# Patient Record
Sex: Female | Born: 1950 | Race: Black or African American | Hispanic: No | Smoking: Never smoker
Health system: Southern US, Community
[De-identification: ages and names within clinical notes are randomized; demographics above are authoritative.]

## PROBLEM LIST (undated history)

## (undated) DIAGNOSIS — T7840XA Allergy, unspecified, initial encounter: Secondary | ICD-10-CM

## (undated) DIAGNOSIS — B019 Varicella without complication: Secondary | ICD-10-CM

## (undated) DIAGNOSIS — R7303 Prediabetes: Secondary | ICD-10-CM

## (undated) DIAGNOSIS — M1990 Unspecified osteoarthritis, unspecified site: Secondary | ICD-10-CM

## (undated) DIAGNOSIS — I10 Essential (primary) hypertension: Secondary | ICD-10-CM

## (undated) DIAGNOSIS — R55 Syncope and collapse: Secondary | ICD-10-CM

## (undated) DIAGNOSIS — E785 Hyperlipidemia, unspecified: Secondary | ICD-10-CM

## (undated) HISTORY — PX: TUBAL LIGATION: SHX77

## (undated) HISTORY — DX: Prediabetes: R73.03

## (undated) HISTORY — DX: Syncope and collapse: R55

## (undated) HISTORY — DX: Varicella without complication: B01.9

## (undated) HISTORY — DX: Hyperlipidemia, unspecified: E78.5

## (undated) HISTORY — DX: Allergy, unspecified, initial encounter: T78.40XA

## (undated) HISTORY — PX: COLONOSCOPY: SHX174

## (undated) HISTORY — DX: Essential (primary) hypertension: I10

## (undated) HISTORY — DX: Unspecified osteoarthritis, unspecified site: M19.90

---

## 2003-04-05 ENCOUNTER — Other Ambulatory Visit: Admission: RE | Admit: 2003-04-05 | Discharge: 2003-04-05 | Payer: Self-pay | Admitting: Obstetrics and Gynecology

## 2016-09-07 LAB — HEPATIC FUNCTION PANEL
ALK PHOS: 79 (ref 25–125)
ALT: 37 — AB (ref 7–35)
AST: 22 (ref 13–35)
Bilirubin, Total: 0.3

## 2016-09-07 LAB — BASIC METABOLIC PANEL
BUN: 19 (ref 4–21)
Creatinine: 1 (ref 0.5–1.1)
GLUCOSE: 119
POTASSIUM: 3.6 (ref 3.4–5.3)
SODIUM: 139 (ref 137–147)

## 2016-09-07 LAB — HEMOGLOBIN A1C: Hemoglobin A1C: 6.3

## 2016-09-07 LAB — CBC AND DIFFERENTIAL
HEMATOCRIT: 40 (ref 36–46)
HEMOGLOBIN: 13.1 (ref 12.0–16.0)
Platelets: 359 (ref 150–399)
WBC: 4.6

## 2017-03-22 ENCOUNTER — Ambulatory Visit (INDEPENDENT_AMBULATORY_CARE_PROVIDER_SITE_OTHER): Payer: 59 | Admitting: Family Medicine

## 2017-03-22 ENCOUNTER — Encounter: Payer: Self-pay | Admitting: Family Medicine

## 2017-03-22 ENCOUNTER — Telehealth: Payer: Self-pay | Admitting: Family Medicine

## 2017-03-22 VITALS — BP 128/80 | Temp 97.6°F | Ht 66.0 in | Wt 212.4 lb

## 2017-03-22 DIAGNOSIS — Z13820 Encounter for screening for osteoporosis: Secondary | ICD-10-CM | POA: Insufficient documentation

## 2017-03-22 DIAGNOSIS — I10 Essential (primary) hypertension: Secondary | ICD-10-CM | POA: Diagnosis not present

## 2017-03-22 DIAGNOSIS — Z1211 Encounter for screening for malignant neoplasm of colon: Secondary | ICD-10-CM | POA: Diagnosis not present

## 2017-03-22 DIAGNOSIS — E559 Vitamin D deficiency, unspecified: Secondary | ICD-10-CM | POA: Diagnosis not present

## 2017-03-22 DIAGNOSIS — E119 Type 2 diabetes mellitus without complications: Secondary | ICD-10-CM | POA: Diagnosis not present

## 2017-03-22 LAB — LIPID PANEL
CHOL/HDL RATIO: 2
Cholesterol: 147 mg/dL (ref 0–200)
HDL: 60.2 mg/dL (ref >39.00)
LDL Cholesterol: 79 mg/dL (ref 0–99)
NONHDL: 87.11
Triglycerides: 39 mg/dL (ref 0.0–149.0)
VLDL: 7.8 mg/dL (ref 0.0–40.0)

## 2017-03-22 LAB — TSH: TSH: 2.4 u[IU]/mL (ref 0.35–4.50)

## 2017-03-22 LAB — COMPREHENSIVE METABOLIC PANEL
ALK PHOS: 60 U/L (ref 39–117)
ALT: 19 U/L (ref 0–35)
AST: 18 U/L (ref 0–37)
Albumin: 3.9 g/dL (ref 3.5–5.2)
BILIRUBIN TOTAL: 0.6 mg/dL (ref 0.2–1.2)
BUN: 22 mg/dL (ref 6–23)
CO2: 31 mEq/L (ref 19–32)
Calcium: 9.3 mg/dL (ref 8.4–10.5)
Chloride: 108 mEq/L (ref 96–112)
Creatinine, Ser: 1.09 mg/dL (ref 0.40–1.20)
GFR: 64.42 mL/min (ref >60.00)
Glucose, Bld: 96 mg/dL (ref 70–99)
Potassium: 4.1 mEq/L (ref 3.5–5.1)
Sodium: 144 mEq/L (ref 135–145)
TOTAL PROTEIN: 6.8 g/dL (ref 6.0–8.3)

## 2017-03-22 LAB — URINALYSIS, ROUTINE W REFLEX MICROSCOPIC
Bilirubin Urine: NEGATIVE
Ketones, ur: NEGATIVE
Nitrite: NEGATIVE
SPECIFIC GRAVITY, URINE: 1.025 (ref 1.000–1.030)
TOTAL PROTEIN, URINE-UPE24: NEGATIVE
URINE GLUCOSE: NEGATIVE
UROBILINOGEN UA: 0.2 (ref 0.0–1.0)
pH: 5.5 (ref 5.0–8.0)

## 2017-03-22 LAB — CBC
HCT: 40.2 % (ref 36.0–46.0)
HEMOGLOBIN: 13.3 g/dL (ref 12.0–15.0)
MCHC: 33.2 g/dL (ref 30.0–36.0)
MCV: 87.3 fl (ref 78.0–100.0)
Platelets: 319 10*3/uL (ref 150.0–400.0)
RBC: 4.6 Mil/uL (ref 3.87–5.11)
RDW: 14.8 % (ref 11.5–15.5)
WBC: 3.1 10*3/uL — AB (ref 4.0–10.5)

## 2017-03-22 LAB — MICROALBUMIN / CREATININE URINE RATIO
Creatinine,U: 144.5 mg/dL
Microalb Creat Ratio: 0.5 mg/g (ref 0.0–30.0)
Microalb, Ur: <0.7 mg/dL (ref 0.0–1.9)

## 2017-03-22 LAB — VITAMIN D 25 HYDROXY (VIT D DEFICIENCY, FRACTURES): VITD: 27 ng/mL — AB (ref 30.00–100.00)

## 2017-03-22 LAB — HEMOGLOBIN A1C: Hgb A1c MFr Bld: 6.7 % — ABNORMAL HIGH (ref 4.6–6.5)

## 2017-03-22 MED ORDER — METFORMIN HCL ER 500 MG PO TB24: 500.0000 mg | ORAL_TABLET | Freq: Every day | ORAL | 1 refills | Status: DC

## 2017-03-22 MED ORDER — VITAMIN D (ERGOCALCIFEROL) 1.25 MG (50000 UNIT) PO CAPS: 50000.0000 [IU] | ORAL_CAPSULE | ORAL | 0 refills | Status: DC

## 2017-03-22 MED ORDER — LISINOPRIL-HYDROCHLOROTHIAZIDE 10-12.5 MG PO TABS: 1.0000 | ORAL_TABLET | Freq: Every day | ORAL | 1 refills | Status: DC

## 2017-03-22 MED ORDER — METFORMIN HCL ER 500 MG PO TB24: ORAL_TABLET | ORAL | 1 refills | Status: DC

## 2017-03-22 NOTE — Addendum Note (Signed)
Addended by: Kateri Mc E on: 03/22/2017 01:11 PM   Modules accepted: Orders

## 2017-03-22 NOTE — Progress Notes (Addendum)
Subjective:  Patient ID: Ann Mcguire, female    DOB: 31-Aug-1950  Age: 66 y.o. MRN: [id number]  CC: Annual Exam   HPI Ann Mcguire presents for establishment of care and follow-up of her blood pressure and diabetes.  These have both been well controlled.  She has been out of her blood pressure medicine for 2 days.  She continues to work for regular.  She is doing well.  She is up-to-date on her mammogram and Pap smears.  She will need a colonoscopy.  Recent eye exam was normal.  She continues to refuse flu vaccines and pneumonia vaccines.  Her daughter is a Marine scientist who works in the [employer].  History Ann Mcguire has a past medical history of Chicken pox, Fainting spell, and Hypertension.   She has no past surgical history on file.   Her family history includes Hypertension in her sister.She reports that  has never smoked. she has never used smokeless tobacco. She reports that she does not drink alcohol or use drugs.  Outpatient Medications Prior to Visit  Medication Sig Dispense Refill  . lisinopril-hydrochlorothiazide (PRINZIDE,ZESTORETIC) 20-12.5 MG tablet Take 1 tablet by mouth daily.    . metFORMIN (GLUCOPHAGE-XR) 500 MG 24 hr tablet Take 500 mg by mouth at bedtime.     No facility-administered medications prior to visit.     ROS Review of Systems  Constitutional: Negative.   HENT: Negative.   Eyes: Negative for photophobia and visual disturbance.  Respiratory: Negative.   Cardiovascular: Negative.   Gastrointestinal: Negative.   Endocrine: Negative for polyphagia and polyuria.  Genitourinary: Negative for hematuria.  Musculoskeletal: Negative for arthralgias and gait problem.  Allergic/Immunologic: Negative for immunocompromised state.  Neurological: Negative for weakness, numbness and headaches.  Hematological: Does not bruise/bleed easily.  Psychiatric/Behavioral: Negative for dysphoric mood.    Objective:  BP 128/80 (BP Location: Left Arm,  Patient Position: Sitting, Cuff Size: Normal)   Temp 97.6 F (36.4 C) (Oral)   Ht 5\' 6"  (1.676 m)   Wt 212 lb 6 oz (96.3 kg)   BMI 34.28 kg/m   Physical Exam  Constitutional: She is oriented to person, place, and time. She appears well-developed and well-nourished. No distress.  HENT:  Head: Normocephalic and atraumatic.  Right Ear: External ear normal.  Left Ear: External ear normal.  Mouth/Throat: Oropharynx is clear and moist. No oropharyngeal exudate.  Eyes: Conjunctivae are normal. Pupils are equal, round, and reactive to light. Right eye exhibits no discharge. Left eye exhibits no discharge. No scleral icterus.  Neck: Neck supple. No JVD present. No tracheal deviation present. No thyromegaly present.  Cardiovascular: Normal rate, regular rhythm and normal heart sounds.  Pulmonary/Chest: Effort normal and breath sounds normal. No stridor.  Abdominal: Soft. Bowel sounds are normal.  Musculoskeletal: She exhibits no edema or tenderness.  Lymphadenopathy:    She has no cervical adenopathy.  Neurological: She is alert and oriented to person, place, and time.  Skin: Skin is warm and dry. No rash noted. She is not diaphoretic.  Psychiatric: She has a normal mood and affect. Her behavior is normal.      Assessment & Plan:   Ann Mcguire was seen today for annual exam.  Diagnoses and all orders for this visit:  Essential hypertension -     CBC -     Comprehensive metabolic panel -     Lipid panel -     TSH -     Urinalysis, Routine w reflex microscopic -  lisinopril-hydrochlorothiazide (PRINZIDE,ZESTORETIC) 10-12.5 MG tablet; Take 1 tablet by mouth daily.  Type 2 diabetes mellitus without complication, without long-term current use of insulin (HCC) -     CBC -     Comprehensive metabolic panel -     Hemoglobin A1c -     Lipid panel -     TSH -     Urinalysis, Routine w reflex microscopic -     Microalbumin / creatinine urine ratio -     lisinopril-hydrochlorothiazide  (PRINZIDE,ZESTORETIC) 10-12.5 MG tablet; Take 1 tablet by mouth daily. -     Discontinue: metFORMIN (GLUCOPHAGE-XR) 500 MG 24 hr tablet; Nightly before bed -     metFORMIN (GLUCOPHAGE-XR) 500 MG 24 hr tablet; Take 1 tablet (500 mg total) by mouth at bedtime.  Vitamin D deficiency -     VITAMIN D 25 Hydroxy (Vit-D Deficiency, Fractures) -     Vitamin D, Ergocalciferol, (DRISDOL) 50000 units CAPS capsule; Take 1 capsule (50,000 Units total) by mouth every 7 (seven) days.  Screen for colon cancer -     Ambulatory referral to Gastroenterology   I have discontinued [name]. Carbin's lisinopril-hydrochlorothiazide and metFORMIN. I have also changed her metFORMIN. Additionally, I am having her start on lisinopril-hydrochlorothiazide and Vitamin D (Ergocalciferol).   Her blood pressure today looks good considering she has not had any medicine in 3 days.  I decreased her lisinopril dose from 20 mg to 10 mg.  She will follow-up in 3 months.  Meds ordered this encounter  Medications  . lisinopril-hydrochlorothiazide (PRINZIDE,ZESTORETIC) 10-12.5 MG tablet    Sig: Take 1 tablet by mouth daily.    Dispense:  90 tablet    Refill:  1  . DISCONTD: metFORMIN (GLUCOPHAGE-XR) 500 MG 24 hr tablet    Sig: Nightly before bed    Dispense:  90 tablet    Refill:  1  . metFORMIN (GLUCOPHAGE-XR) 500 MG 24 hr tablet    Sig: Take 1 tablet (500 mg total) by mouth at bedtime.    Dispense:  90 tablet    Refill:  1  . Vitamin D, Ergocalciferol, (DRISDOL) 50000 units CAPS capsule    Sig: Take 1 capsule (50,000 Units total) by mouth every 7 (seven) days.    Dispense:  30 capsule    Refill:  0     Follow-up: Return in about 3 months (around 06/20/2017).  Libby Maw, MD

## 2017-03-22 NOTE — Telephone Encounter (Signed)
Returning  Call  And  [place]  Results given   Informed  Of  Vit  D  rx   And  Pt   Denies  Any  Urinary  Symptoms    Verbalizes  Knowledge  Of  Plan of  Care

## 2017-03-22 NOTE — Addendum Note (Signed)
Addended by: Abelino Derrick A on: 03/22/2017 02:05 PM   Modules accepted: Orders

## 2017-03-25 NOTE — Telephone Encounter (Signed)
Noted! Thank you

## 2017-04-25 ENCOUNTER — Encounter: Payer: Self-pay | Admitting: Family Medicine

## 2017-05-23 ENCOUNTER — Encounter: Payer: Self-pay | Admitting: Family Medicine

## 2017-06-21 ENCOUNTER — Ambulatory Visit: Payer: 59 | Admitting: Family Medicine

## 2017-10-22 LAB — HM MAMMOGRAPHY

## 2017-10-24 ENCOUNTER — Encounter: Payer: Self-pay | Admitting: Family Medicine

## 2017-11-01 ENCOUNTER — Other Ambulatory Visit (HOSPITAL_COMMUNITY)
Admission: RE | Admit: 2017-11-01 | Discharge: 2017-11-01 | Disposition: A | Discharge status: Home / Self Care | Payer: 59 | Source: Ambulatory Visit | Attending: Family Medicine | Admitting: Family Medicine

## 2017-11-01 ENCOUNTER — Ambulatory Visit (INDEPENDENT_AMBULATORY_CARE_PROVIDER_SITE_OTHER): Payer: 59 | Admitting: Family Medicine

## 2017-11-01 ENCOUNTER — Other Ambulatory Visit: Payer: 59

## 2017-11-01 ENCOUNTER — Encounter: Payer: Self-pay | Admitting: Family Medicine

## 2017-11-01 VITALS — BP 128/80 | HR 77 | Ht 66.0 in | Wt 219.2 lb

## 2017-11-01 DIAGNOSIS — Z91199 Patient's noncompliance with other medical treatment and regimen due to unspecified reason: Secondary | ICD-10-CM

## 2017-11-01 DIAGNOSIS — Z13820 Encounter for screening for osteoporosis: Secondary | ICD-10-CM

## 2017-11-01 DIAGNOSIS — I10 Essential (primary) hypertension: Secondary | ICD-10-CM

## 2017-11-01 DIAGNOSIS — Z9119 Patient's noncompliance with other medical treatment and regimen: Secondary | ICD-10-CM | POA: Insufficient documentation

## 2017-11-01 DIAGNOSIS — E119 Type 2 diabetes mellitus without complications: Secondary | ICD-10-CM | POA: Diagnosis not present

## 2017-11-01 DIAGNOSIS — M7062 Trochanteric bursitis, left hip: Secondary | ICD-10-CM

## 2017-11-01 DIAGNOSIS — E559 Vitamin D deficiency, unspecified: Secondary | ICD-10-CM | POA: Insufficient documentation

## 2017-11-01 DIAGNOSIS — Z7984 Long term (current) use of oral hypoglycemic drugs: Secondary | ICD-10-CM | POA: Diagnosis not present

## 2017-11-01 DIAGNOSIS — R8281 Pyuria: Secondary | ICD-10-CM

## 2017-11-01 DIAGNOSIS — Z79899 Other long term (current) drug therapy: Secondary | ICD-10-CM | POA: Diagnosis not present

## 2017-11-01 DIAGNOSIS — Z1211 Encounter for screening for malignant neoplasm of colon: Secondary | ICD-10-CM | POA: Diagnosis not present

## 2017-11-01 DIAGNOSIS — N390 Urinary tract infection, site not specified: Secondary | ICD-10-CM | POA: Insufficient documentation

## 2017-11-01 LAB — CBC
HEMATOCRIT: 39.8 % (ref 36.0–46.0)
Hemoglobin: 13.2 g/dL (ref 12.0–15.0)
MCHC: 33.1 g/dL (ref 30.0–36.0)
MCV: 86 fl (ref 78.0–100.0)
Platelets: 340 10*3/uL (ref 150.0–400.0)
RBC: 4.62 Mil/uL (ref 3.87–5.11)
RDW: 15.1 % (ref 11.5–15.5)
WBC: 3.4 10*3/uL — ABNORMAL LOW (ref 4.0–10.5)

## 2017-11-01 LAB — COMPREHENSIVE METABOLIC PANEL
ALBUMIN: 3.8 g/dL (ref 3.5–5.2)
ALT: 13 U/L (ref 0–35)
AST: 16 U/L (ref 0–37)
Alkaline Phosphatase: 54 U/L (ref 39–117)
BUN: 14 mg/dL (ref 6–23)
CHLORIDE: 106 meq/L (ref 96–112)
CO2: 29 meq/L (ref 19–32)
CREATININE: 1.12 mg/dL (ref 0.40–1.20)
Calcium: 9.3 mg/dL (ref 8.4–10.5)
GFR: 62.31 mL/min (ref >60.00)
Glucose, Bld: 108 mg/dL — ABNORMAL HIGH (ref 70–99)
POTASSIUM: 4 meq/L (ref 3.5–5.1)
SODIUM: 141 meq/L (ref 135–145)
Total Bilirubin: 0.5 mg/dL (ref 0.2–1.2)
Total Protein: 7 g/dL (ref 6.0–8.3)

## 2017-11-01 LAB — URINALYSIS, ROUTINE W REFLEX MICROSCOPIC
Bilirubin Urine: NEGATIVE
KETONES UR: NEGATIVE
NITRITE: NEGATIVE
PH: 7 (ref 5.0–8.0)
RBC / HPF: NONE SEEN (ref 0–?)
SPECIFIC GRAVITY, URINE: 1.01 (ref 1.000–1.030)
TOTAL PROTEIN, URINE-UPE24: NEGATIVE
URINE GLUCOSE: NEGATIVE
UROBILINOGEN UA: 0.2 (ref 0.0–1.0)

## 2017-11-01 LAB — MICROALBUMIN / CREATININE URINE RATIO
CREATININE, U: 189.7 mg/dL
Microalb Creat Ratio: 0.4 mg/g (ref 0.0–30.0)
Microalb, Ur: <0.7 mg/dL (ref 0.0–1.9)

## 2017-11-01 LAB — HEMOGLOBIN A1C: Hgb A1c MFr Bld: 6.8 % — ABNORMAL HIGH (ref 4.6–6.5)

## 2017-11-01 LAB — VITAMIN D 25 HYDROXY (VIT D DEFICIENCY, FRACTURES): VITD: 20.73 ng/mL — ABNORMAL LOW (ref 30.00–100.00)

## 2017-11-01 MED ORDER — METFORMIN HCL ER 500 MG PO TB24: 500.0000 mg | ORAL_TABLET | Freq: Every day | ORAL | 1 refills | Status: DC

## 2017-11-01 MED ORDER — LISINOPRIL-HYDROCHLOROTHIAZIDE 10-12.5 MG PO TABS: 1.0000 | ORAL_TABLET | Freq: Every day | ORAL | 1 refills | Status: DC

## 2017-11-01 MED ORDER — AZITHROMYCIN 250 MG PO TABS: ORAL_TABLET | ORAL | 0 refills | Status: DC

## 2017-11-01 NOTE — Patient Instructions (Addendum)
Colorectal Cancer Screening Colorectal cancer screening is a group of tests used to check for colorectal cancer. Colorectal refers to your colon and rectum. Your colon and rectum are located at the end of your large intestine and carry your bowel movements out of your body. Why is colorectal cancer screening done? It is common for abnormal growths (polyps) to form in the lining of your colon, especially as you get older. These polyps can be cancerous or become cancerous. If colorectal cancer is found at an early stage, it is treatable. Who should be screened for colorectal cancer? Screening is recommended for all adults at average risk starting at age 47. Tests may be recommended every 1 to 10 years. Your health care provider may recommend earlier or more frequent screening if you have:  A history of colorectal cancer or polyps.  A family member with a history of colorectal cancer or polyps.  Inflammatory bowel disease, such as ulcerative colitis or Crohn disease.  A type of hereditary colon cancer syndrome.  Colorectal cancer symptoms.  Types of screening tests There are several types of colorectal screening tests. They include:  Guaiac-based fecal occult blood testing.  Fecal immunochemical test (FIT).  Stool DNA test.  Barium enema.  Virtual colonoscopy.  Sigmoidoscopy. During this test, a sigmoidoscope is used to examine your rectum and lower colon. A sigmoidoscope is a flexible tube with a camera that is inserted through your anus into your rectum and lower colon.  Colonoscopy. During this test, a colonoscope is used to examine your entire colon. A colonoscope is a long, thin, flexible tube with a camera. This test examines your entire colon and rectum.  This information is not intended to replace advice given to you by your health care provider. Make sure you discuss any questions you have with your health care provider. Document Released: 09/13/2009 Document Revised:  11/03/2015 Document Reviewed: 07/02/2013 Elsevier Interactive Patient Education  2018 [place] protect organs, store calcium, and anchor muscles. Good health habits, such as eating nutritious foods and exercising regularly, are important for maintaining healthy bones. They can also help to prevent a condition that causes bones to lose density and become weak and brittle (osteoporosis). Why is bone mass important? Bone mass refers to the amount of bone tissue that you have. The higher your bone mass, the stronger your bones. An important step toward having healthy bones throughout life is to have strong and dense bones during childhood. A young adult who has a high bone mass is more likely to have a high bone mass later in life. Bone mass at its greatest it is called peak bone mass. A large decline in bone mass occurs in older adults. In women, it occurs about the time of menopause. During this time, it is important to practice good health habits, because if more bone is lost than what is replaced, the bones will become less healthy and more likely to break (fracture). If you find that you have a low bone mass, you may be able to prevent osteoporosis or further bone loss by changing your diet and lifestyle. How can I find out if my bone mass is low? Bone mass can be measured with an X-ray test that is called a bone mineral density (BMD) test. This test is recommended for all women who are age 47 or older. It may also be recommended for men who are age 71 or older, or for people who are more likely to develop osteoporosis  due to:  Having bones that break easily.  Having a long-term disease that weakens bones, such as kidney disease or rheumatoid arthritis.  Having menopause earlier than normal.  Taking medicine that weakens bones, such as steroids, thyroid hormones, or hormone treatment for breast cancer or prostate cancer.  Smoking.  Drinking three or more alcoholic  drinks each day.  What are the nutritional recommendations for healthy bones? To have healthy bones, you need to get enough of the right minerals and vitamins. Most nutrition experts recommend getting these nutrients from the foods that you eat. Nutritional recommendations vary from person to person. Ask your health care provider what is healthy for you. Here are some general guidelines. Calcium Recommendations Calcium is the most important (essential) mineral for bone health. Most people can get enough calcium from their diet, but supplements may be recommended for people who are at risk for osteoporosis. Good sources of calcium include:  Dairy products, such as low-fat or nonfat milk, cheese, and yogurt.  Dark green leafy vegetables, such as bok choy and broccoli.  Calcium-fortified foods, such as orange juice, cereal, bread, soy beverages, and tofu products.  Nuts, such as almonds.  Follow these recommended amounts for daily calcium intake:  Children, age 11?3: 700 mg.  Children, age 48?8: 1,000 mg.  Children, age 24?13: 1,300 mg.  Teens, age 74?18: 1,300 mg.  Adults, age 3?50: 1,000 mg.  Adults, age 117?70: ? Men: 1,000 mg. ? Women: 1,200 mg.  Adults, age 61 or older: 1,200 mg.  Pregnant and breastfeeding females: ? Teens: 1,300 mg. ? Adults: 1,000 mg.  Vitamin D Recommendations Vitamin D is the most essential vitamin for bone health. It helps the body to absorb calcium. Sunlight stimulates the skin to make vitamin D, so be sure to get enough sunlight. If you live in a cold climate or you do not get outside often, your health care provider may recommend that you take vitamin D supplements. Good sources of vitamin D in your diet include:  Egg yolks.  Saltwater fish.  Milk and cereal fortified with vitamin D.  Follow these recommended amounts for daily vitamin D intake:  Children and teens, age 23?18: 45 international units.  Adults, age 485 or younger: 400-800  international units.  Adults, age 487 or older: 800-1,000 international units.  Other Nutrients Other nutrients for bone health include:  Phosphorus. This mineral is found in meat, poultry, dairy foods, nuts, and legumes. The recommended daily intake for adult men and adult women is 700 mg.  Magnesium. This mineral is found in seeds, nuts, dark green vegetables, and legumes. The recommended daily intake for adult men is 400?420 mg. For adult women, it is 310?320 mg.  Vitamin K. This vitamin is found in green leafy vegetables. The recommended daily intake is 120 mg for adult men and 90 mg for adult women.  What type of physical activity is best for building and maintaining healthy bones? Weight-bearing and strength-building activities are important for building and maintaining peak bone mass. Weight-bearing activities cause muscles and bones to work against gravity. Strength-building activities increases muscle strength that supports bones. Weight-bearing and muscle-building activities include:  Walking and hiking.  Jogging and running.  Dancing.  Gym exercises.  Lifting weights.  Tennis and racquetball.  Climbing stairs.  Aerobics.  Adults should get at least 30 minutes of moderate physical activity on most days. Children should get at least 60 minutes of moderate physical activity on most days. Ask your health care provide what  type of exercise is best for you. Where can I find more information? For more information, check out the following websites:  [place]: [employer]se  [employer] of Health: http://www.niams.AnonymousEar.fr.asp  This information is not intended to replace advice given to you by your health care provider. Make sure you discuss any questions you have with your health care provider. Document Released: 06/16/2003 Document Revised: 10/14/2015 Document Reviewed:  03/31/2014 Elsevier Interactive Patient Education  2018 [employer].  Diabetes Mellitus and Exercise Exercising regularly is important for your overall health, especially when you have diabetes (diabetes mellitus). Exercising is not only about losing weight. It has many health benefits, such as increasing muscle strength and bone density and reducing body fat and stress. This leads to improved fitness, flexibility, and endurance, all of which result in better overall health. Exercise has additional benefits for people with diabetes, including:  Reducing appetite.  Helping to lower and control blood glucose.  Lowering blood pressure.  Helping to control amounts of fatty substances (lipids) in the blood, such as cholesterol and triglycerides.  Helping the body to respond better to insulin (improving insulin sensitivity).  Reducing how much insulin the body needs.  Decreasing the risk for heart disease by: ? Lowering cholesterol and triglyceride levels. ? Increasing the levels of good cholesterol. ? Lowering blood glucose levels.  What is my activity plan? Your health care provider or certified diabetes educator can help you make a plan for the type and frequency of exercise (activity plan) that works for you. Make sure that you:  Do at least 150 minutes of moderate-intensity or vigorous-intensity exercise each week. This could be brisk walking, biking, or water aerobics. ? Do stretching and strength exercises, such as yoga or weightlifting, at least 2 times a week. ? Spread out your activity over at least 3 days of the week.  Get some form of physical activity every day. ? Do not go more than 2 days in a row without some kind of physical activity. ? Avoid being inactive for more than 90 minutes at a time. Take frequent breaks to walk or stretch.  Choose a type of exercise or activity that you enjoy, and set realistic goals.  Start slowly, and gradually increase the intensity of  your exercise over time.  What do I need to know about managing my diabetes?  Check your blood glucose before and after exercising. ? If your blood glucose is higher than 240 mg/dL (13.3 mmol/L) before you exercise, check your urine for ketones. If you have ketones in your urine, do not exercise until your blood glucose returns to normal.  Know the symptoms of low blood glucose (hypoglycemia) and how to treat it. Your risk for hypoglycemia increases during and after exercise. Common symptoms of hypoglycemia can include: ? Hunger. ? Anxiety. ? Sweating and feeling clammy. ? Confusion. ? Dizziness or feeling light-headed. ? Increased heart rate or palpitations. ? Blurry vision. ? Tingling or numbness around the mouth, lips, or tongue. ? Tremors or shakes. ? Irritability.  Keep a rapid-acting carbohydrate snack available before, during, and after exercise to help prevent or treat hypoglycemia.  Avoid injecting insulin into areas of the body that are going to be exercised. For example, avoid injecting insulin into: ? The arms, when playing tennis. ? The legs, when jogging.  Keep records of your exercise habits. Doing this can help you and your health care provider adjust your diabetes management plan as needed. Write down: ? Food that you eat  before and after you exercise. ? Blood glucose levels before and after you exercise. ? The type and amount of exercise you have done. ? When your insulin is expected to peak, if you use insulin. Avoid exercising at times when your insulin is peaking.  When you start a new exercise or activity, work with your health care provider to make sure the activity is safe for you, and to adjust your insulin, medicines, or food intake as needed.  Drink plenty of water while you exercise to prevent dehydration or heat stroke. Drink enough fluid to keep your urine clear or pale yellow. This information is not intended to replace advice given to you by your  health care provider. Make sure you discuss any questions you have with your health care provider. Document Released: 06/16/2003 Document Revised: 10/14/2015 Document Reviewed: 09/05/2015 Elsevier Interactive Patient Education  2018 [place].  Hip Bursitis Hip bursitis is inflammation of a fluid-filled sac (bursa) in the hip joint. The bursa protects the bones in the hip joint from rubbing against each other. Hip bursitis can cause mild to moderate pain, and symptoms often come and go over time. What are the causes? This condition may be caused by:  Injury to the hip.  Overuse of the muscles that surround the hip joint.  Arthritis or gout.  Diabetes.  Thyroid disease.  Cold weather.  Infection.  In some cases, the cause may not be known. What are the signs or symptoms? Symptoms of this condition may include:  Mild or moderate pain in the hip area. Pain may get worse with movement.  Tenderness and swelling of the hip, especially on the outer side of the hip.  Symptoms may come and go. If the bursa becomes infected, you may have the following symptoms:  Fever.  Red skin and a feeling of warmth in the hip area.  How is this diagnosed? This condition may be diagnosed based on:  A physical exam.  Your medical history.  X-rays.  Removal of fluid from your inflamed bursa for testing (biopsy).  You may be sent to a health care provider who specializes in bone diseases (orthopedist) or a provider who specializes in joint inflammation (rheumatologist). How is this treated? This condition is treated by resting, raising (elevating), and applying pressure(compression) to the injured area. In some cases, this may be enough to make your symptoms go away. Treatment may also include:  Crutches.  Antibiotic medicine.  Draining fluid out of the bursa to help relieve swelling.  Injecting medicine that helps to reduce inflammation (cortisone).  Follow these instructions  at home: Medicines  Take over-the-counter and prescription medicines only as told by your health care provider.  Do not drive or operate heavy machinery while taking prescription pain medicine, or as told by your health care provider.  If you were prescribed an antibiotic, take it as told by your health care provider. Do not stop taking the antibiotic even if you start to feel better. Activity  Return to your normal activities as told by your health care provider. Ask your health care provider what activities are safe for you.  Rest and protect your hip as much as possible until your pain and swelling get better. General instructions  Wear compression wraps only as told by your health care provider.  Elevate your hip above the level of your heart as much as you can without pain. To do this, try putting a pillow under your hips while you lie down.  Do  not use your hip to support your body weight until your health care provider says that you can. Use crutches as told by your health care provider.  Gently massage and stretch your injured area as often as is comfortable.  Keep all follow-up visits as told by your health care provider. This is important. How is this prevented?  Exercise regularly, as told by your health care provider.  Warm up and stretch before being active.  Cool down and stretch after being active.  If an activity irritates your hip or causes pain, avoid the activity as much as possible.  Avoid sitting down for long periods at a time. Contact a health care provider if:  You have a fever.  You develop new symptoms.  You have difficulty walking or doing everyday activities.  You have pain that gets worse or does not get better with medicine.  You develop red skin or a feeling of warmth in your hip area. Get help right away if:  You cannot move your hip.  You have severe pain. This information is not intended to replace advice given to you by your health  care provider. Make sure you discuss any questions you have with your health care provider. Document Released: 09/15/2001 Document Revised: 09/01/2015 Document Reviewed: 10/26/2014 Elsevier Interactive Patient Education  [employer].

## 2017-11-01 NOTE — Progress Notes (Addendum)
Subjective:  Patient ID: Ann Mcguire, female    DOB: 04-25-1950  Age: 67 y.o. MRN: [id number]  CC: Follow-up   HPI THERA BASDEN presents for follow-up of her hypertension that is well controlled with Zestoretic.  She is having no issues with this medication.  She is compliant with her metformin.  She has no regular dedicated exercise but she does spend a lot of time working out in her yard in her flower beds.  She tells me that she is taking her vitamin D had a high dose weekly.  She did have a mammogram done this year which is been normal.  She has been referred multiple occasions for colonoscopy but has not kept the appointment.  Has not had her eyes checked in some time she has not been checking her feet.  She has been having intermittent soreness in her left lateral thigh..  There is been no injury.  No numbness or tingling or weakness.  Outpatient Medications Prior to Visit  Medication Sig Dispense Refill  . Vitamin D, Ergocalciferol, (DRISDOL) 50000 units CAPS capsule Take 1 capsule (50,000 Units total) by mouth every 7 (seven) days. 30 capsule 0  . lisinopril-hydrochlorothiazide (PRINZIDE,ZESTORETIC) 10-12.5 MG tablet Take 1 tablet by mouth daily. 90 tablet 1  . metFORMIN (GLUCOPHAGE-XR) 500 MG 24 hr tablet Take 1 tablet (500 mg total) by mouth at bedtime. 90 tablet 1   No facility-administered medications prior to visit.     ROS Review of Systems  Constitutional: Negative for chills, fatigue, fever and unexpected weight change.  HENT: Negative.   Eyes: Negative for photophobia and visual disturbance.  Respiratory: Negative.   Cardiovascular: Negative for palpitations and leg swelling.  Gastrointestinal: Negative.   Endocrine: Negative for polyphagia and polyuria.  Genitourinary: Negative for difficulty urinating, hematuria and vaginal bleeding.  Musculoskeletal: Positive for arthralgias.  Skin: Negative for color change and pallor.  Allergic/Immunologic: Negative for  immunocompromised state.  Neurological: Negative for weakness and numbness.  Hematological: Does not bruise/bleed easily.  Psychiatric/Behavioral: Negative.     Objective:  BP 128/80   Pulse 77   Ht 5\' 6"  (1.676 m)   Wt 219 lb 4 oz (99.5 kg)   SpO2 98%   BMI 35.39 kg/m   BP Readings from Last 3 Encounters:  11/01/17 128/80  03/22/17 128/80    Wt Readings from Last 3 Encounters:  11/01/17 219 lb 4 oz (99.5 kg)  03/22/17 212 lb 6 oz (96.3 kg)    Physical Exam  Constitutional: She is oriented to person, place, and time. She appears well-developed and well-nourished. No distress.  HENT:  Head: Normocephalic and atraumatic.  Right Ear: External ear normal.  Left Ear: External ear normal.  Mouth/Throat: Oropharynx is clear and moist. No oropharyngeal exudate.  Eyes: Pupils are equal, round, and reactive to light. Conjunctivae and EOM are normal. Right eye exhibits no discharge. Left eye exhibits no discharge. No scleral icterus.  Neck: Normal range of motion. Neck supple. No JVD present. No tracheal deviation present. No thyromegaly present.  Cardiovascular: Normal rate, regular rhythm and normal heart sounds.  Pulses:      Dorsalis pedis pulses are 2+ on the right side, and 2+ on the left side.       Posterior tibial pulses are 2+ on the right side, and 2+ on the left side.  Pulmonary/Chest: Effort normal and breath sounds normal.  Abdominal: Bowel sounds are normal.  Neurological: She is alert and oriented to person, place, and  time.  Skin: Skin is warm and dry. Capillary refill takes less than 2 seconds. She is not diaphoretic.  Psychiatric: She has a normal mood and affect. Her behavior is normal.   Diabetic Foot Exam - Simple   Simple Foot Form  Visual Inspection  No deformities, no ulcerations, no other skin breakdown bilaterally: Yes  Sensation Testing  Intact to touch and monofilament testing bilaterally: Yes  Pulse Check  Posterior Tibialis and Dorsalis pulse  intact bilaterally: Yes  Comments   Lab Results  Component Value Date   WBC 3.4 (L) 11/01/2017   HGB 13.2 11/01/2017   HCT 39.8 11/01/2017   PLT 340.0 11/01/2017   GLUCOSE 108 (H) 11/01/2017   CHOL 147 03/22/2017   TRIG 39.0 03/22/2017   HDL 60.20 03/22/2017   LDLCALC 79 03/22/2017   ALT 13 11/01/2017   AST 16 11/01/2017   NA 141 11/01/2017   K 4.0 11/01/2017   CL 106 11/01/2017   CREATININE 1.12 11/01/2017   BUN 14 11/01/2017   CO2 29 11/01/2017   TSH 2.40 03/22/2017   HGBA1C 6.8 (H) 11/01/2017   MICROALBUR <0.7 11/01/2017    No results found.  Assessment & Plan:   Sharyon was seen today for follow-up.  Diagnoses and all orders for this visit:  Essential hypertension -     CBC -     Comprehensive metabolic panel -     Urinalysis, Routine w reflex microscopic -     Microalbumin / creatinine urine ratio -     Ambulatory referral to Ophthalmology -     lisinopril-hydrochlorothiazide (PRINZIDE,ZESTORETIC) 10-12.5 MG tablet; Take 1 tablet by mouth daily.  Type 2 diabetes mellitus without complication, without long-term current use of insulin (HCC) -     CBC -     Comprehensive metabolic panel -     Hemoglobin A1c -     Urinalysis, Routine w reflex microscopic -     Microalbumin / creatinine urine ratio -     Ambulatory referral to Ophthalmology -     metFORMIN (GLUCOPHAGE-XR) 500 MG 24 hr tablet; Take 1 tablet (500 mg total) by mouth at bedtime. -     lisinopril-hydrochlorothiazide (PRINZIDE,ZESTORETIC) 10-12.5 MG tablet; Take 1 tablet by mouth daily.  Vitamin D deficiency -     VITAMIN D 25 Hydroxy (Vit-D Deficiency, Fractures)  Screen for colon cancer -     Ambulatory referral to Gastroenterology  Screening for osteoporosis -     DG Bone Density; Future  Trochanteric bursitis of left hip  Pyuria -     Urine cytology ancillary only -     azithromycin (ZITHROMAX) 250 MG tablet; Take 2 today and then one each day until finished.  Medically  noncompliant   I am having [name]. Marland Kitchen start on azithromycin. I am also having her maintain her Vitamin D (Ergocalciferol), metFORMIN, and lisinopril-hydrochlorothiazide.  Meds ordered this encounter  Medications  . metFORMIN (GLUCOPHAGE-XR) 500 MG 24 hr tablet    Sig: Take 1 tablet (500 mg total) by mouth at bedtime.    Dispense:  90 tablet    Refill:  1  . lisinopril-hydrochlorothiazide (PRINZIDE,ZESTORETIC) 10-12.5 MG tablet    Sig: Take 1 tablet by mouth daily.    Dispense:  90 tablet    Refill:  1  . azithromycin (ZITHROMAX) 250 MG tablet    Sig: Take 2 today and then one each day until finished.    Dispense:  6 tablet    Refill:  0   Patient was given anticipatory guidance on colorectal cancer screening and screening for osteoporosis.  Encouraged her to keep her appointments this time and actually go for the screenings.  She was given information on hip bursitis.  She will take OTC IBU 3 times a day for 2 weeks.  This does not help she will RTC for consideration of an injection.  Follow-up: Return in about 6 months (around 05/04/2018).  Libby Maw, MD

## 2017-11-01 NOTE — Addendum Note (Signed)
Addended by: Jon Billings on: 11/01/2017 03:53 PM   Modules accepted: Orders

## 2017-11-01 NOTE — Addendum Note (Signed)
Addended by: Diona Foley on: 11/01/2017 05:02 PM   Modules accepted: Orders

## 2017-11-06 LAB — URINE CYTOLOGY ANCILLARY ONLY: Chlamydia: NEGATIVE

## 2017-11-08 ENCOUNTER — Inpatient Hospital Stay: Admission: RE | Admit: 2017-11-08 | Payer: 59 | Source: Ambulatory Visit

## 2017-11-11 ENCOUNTER — Encounter: Payer: Self-pay | Admitting: Family Medicine

## 2017-11-11 ENCOUNTER — Ambulatory Visit (INDEPENDENT_AMBULATORY_CARE_PROVIDER_SITE_OTHER): Payer: 59 | Admitting: Family Medicine

## 2017-11-11 VITALS — BP 142/100 | HR 65 | Temp 97.9°F | Ht 66.0 in | Wt 222.8 lb

## 2017-11-11 DIAGNOSIS — E559 Vitamin D deficiency, unspecified: Secondary | ICD-10-CM | POA: Diagnosis not present

## 2017-11-11 DIAGNOSIS — I10 Essential (primary) hypertension: Secondary | ICD-10-CM | POA: Diagnosis not present

## 2017-11-11 DIAGNOSIS — R8290 Unspecified abnormal findings in urine: Secondary | ICD-10-CM | POA: Insufficient documentation

## 2017-11-11 DIAGNOSIS — E119 Type 2 diabetes mellitus without complications: Secondary | ICD-10-CM | POA: Diagnosis not present

## 2017-11-11 DIAGNOSIS — D709 Neutropenia, unspecified: Secondary | ICD-10-CM

## 2017-11-11 MED ORDER — FLUCONAZOLE 150 MG PO TABS: 150.0000 mg | ORAL_TABLET | Freq: Once | ORAL | 0 refills | Status: AC

## 2017-11-11 MED ORDER — LISINOPRIL-HYDROCHLOROTHIAZIDE 10-12.5 MG PO TABS: 1.0000 | ORAL_TABLET | Freq: Every day | ORAL | 1 refills | Status: DC

## 2017-11-11 MED ORDER — METFORMIN HCL ER 500 MG PO TB24: 500.0000 mg | ORAL_TABLET | Freq: Every day | ORAL | 1 refills | Status: DC

## 2017-11-11 MED ORDER — VITAMIN D (ERGOCALCIFEROL) 1.25 MG (50000 UNIT) PO CAPS: 50000.0000 [IU] | ORAL_CAPSULE | ORAL | 1 refills | Status: DC

## 2017-11-11 NOTE — Progress Notes (Signed)
Subjective:  Patient ID: Ann Mcguire, female    DOB: 02-17-1951  Age: 67 y.o. MRN: [id number]  CC: Follow-up (follow up on lab results)   HPI Ann Mcguire presents for follow-up of her hypertension that is usually well controlled on her current therapy.  She is a little nervous about coming in to discuss her blood work.  Blood work shows that her vitamin D levels were low despite taking high-dose vitamin D.  She admits that they only gave her a months worth of vitamin D.  Explained that it would take at least 6 months of therapy to raise her levels.  She does have a bone scan scheduled soon but has not been seen for consultation for colonoscopy.  Discussed her urinalysis.  She is having no vaginal discharge, vaginal itching, dysuria frequency or urgency.  CBCs have shown a stable low white blood cell count.  She is having no issues with weight loss night sweats or malaise.  Outpatient Medications Prior to Visit  Medication Sig Dispense Refill  . lisinopril-hydrochlorothiazide (PRINZIDE,ZESTORETIC) 10-12.5 MG tablet Take 1 tablet by mouth daily. 90 tablet 1  . metFORMIN (GLUCOPHAGE-XR) 500 MG 24 hr tablet Take 1 tablet (500 mg total) by mouth at bedtime. 90 tablet 1  . azithromycin (ZITHROMAX) 250 MG tablet Take 2 today and then one each day until finished. (Patient not taking: Reported on 11/11/2017) 6 tablet 0  . Vitamin D, Ergocalciferol, (DRISDOL) 50000 units CAPS capsule Take 1 capsule (50,000 Units total) by mouth every 7 (seven) days. (Patient not taking: Reported on 11/11/2017) 30 capsule 0   No facility-administered medications prior to visit.     ROS Review of Systems  Constitutional: Negative for chills, fever and unexpected weight change.  HENT: Negative.   Eyes: Negative.   Respiratory: Negative.   Cardiovascular: Negative.   Gastrointestinal: Negative.   Endocrine: Negative for polyphagia and polyuria.  Genitourinary: Negative.  Negative for decreased urine volume,  dysuria, vaginal discharge and vaginal pain.  Musculoskeletal: Negative for gait problem and joint swelling.  Skin: Negative for pallor and rash.  Allergic/Immunologic: Negative for immunocompromised state.  Neurological: Negative for light-headedness and headaches.  Hematological: Does not bruise/bleed easily.  Psychiatric/Behavioral: Negative.     Objective:  BP (!) 142/100   Pulse 65   Temp 97.9 F (36.6 C) (Oral)   Ht 5\' 6"  (1.676 m)   Wt 222 lb 12.8 oz (101.1 kg)   SpO2 97%   BMI 35.96 kg/m   BP Readings from Last 3 Encounters:  11/11/17 (!) 142/100  11/01/17 128/80  03/22/17 128/80    Wt Readings from Last 3 Encounters:  11/11/17 222 lb 12.8 oz (101.1 kg)  11/01/17 219 lb 4 oz (99.5 kg)  03/22/17 212 lb 6 oz (96.3 kg)    Physical Exam  Lab Results  Component Value Date   WBC 3.4 (L) 11/01/2017   HGB 13.2 11/01/2017   HCT 39.8 11/01/2017   PLT 340.0 11/01/2017   GLUCOSE 108 (H) 11/01/2017   CHOL 147 03/22/2017   TRIG 39.0 03/22/2017   HDL 60.20 03/22/2017   LDLCALC 79 03/22/2017   ALT 13 11/01/2017   AST 16 11/01/2017   NA 141 11/01/2017   K 4.0 11/01/2017   CL 106 11/01/2017   CREATININE 1.12 11/01/2017   BUN 14 11/01/2017   CO2 29 11/01/2017   TSH 2.40 03/22/2017   HGBA1C 6.8 (H) 11/01/2017   MICROALBUR <0.7 11/01/2017    No results found.  Assessment & Plan:   Ann Mcguire was seen today for follow-up.  Diagnoses and all orders for this visit:  Abnormal urine -     Urinalysis, Routine w reflex microscopic -     fluconazole (DIFLUCAN) 150 MG tablet; Take 1 tablet (150 mg total) by mouth once for 1 dose.  Essential hypertension -     lisinopril-hydrochlorothiazide (PRINZIDE,ZESTORETIC) 10-12.5 MG tablet; Take 1 tablet by mouth daily.  Type 2 diabetes mellitus without complication, without long-term current use of insulin (HCC) -     lisinopril-hydrochlorothiazide (PRINZIDE,ZESTORETIC) 10-12.5 MG tablet; Take 1 tablet by mouth daily. -      metFORMIN (GLUCOPHAGE-XR) 500 MG 24 hr tablet; Take 1 tablet (500 mg total) by mouth at bedtime.  Vitamin D deficiency -     Vitamin D, Ergocalciferol, (DRISDOL) 50000 units CAPS capsule; Take 1 capsule (50,000 Units total) by mouth every 7 (seven) days.  Neutropenia, unspecified type ([place]) -     HIV antibody   I am having Ann Mcguire. Marland Kitchen start on fluconazole. I am also having her maintain her azithromycin, lisinopril-hydrochlorothiazide, metFORMIN, and Vitamin D (Ergocalciferol).  Meds ordered this encounter  Medications  . lisinopril-hydrochlorothiazide (PRINZIDE,ZESTORETIC) 10-12.5 MG tablet    Sig: Take 1 tablet by mouth daily.    Dispense:  90 tablet    Refill:  1  . metFORMIN (GLUCOPHAGE-XR) 500 MG 24 hr tablet    Sig: Take 1 tablet (500 mg total) by mouth at bedtime.    Dispense:  90 tablet    Refill:  1  . Vitamin D, Ergocalciferol, (DRISDOL) 50000 units CAPS capsule    Sig: Take 1 capsule (50,000 Units total) by mouth every 7 (seven) days.    Dispense:  15 capsule    Refill:  1    Please give patient 15 pills with a refill.  . fluconazole (DIFLUCAN) 150 MG tablet    Sig: Take 1 tablet (150 mg total) by mouth once for 1 dose.    Dispense:  1 tablet    Refill:  0   Patient is planning on starting water aerobics and losing weight.  Encouraged her again again to go for a colonoscopy consultation.  She will follow-up in 6 months.  Follow-up: Return in about 6 months (around 05/14/2018), or if symptoms worsen or fail to improve.  Libby Maw, MD

## 2017-11-14 ENCOUNTER — Encounter (INDEPENDENT_AMBULATORY_CARE_PROVIDER_SITE_OTHER): Payer: Self-pay | Admitting: Ophthalmology

## 2017-11-22 ENCOUNTER — Inpatient Hospital Stay: Admission: RE | Admit: 2017-11-22 | Payer: 59 | Source: Ambulatory Visit

## 2017-11-22 ENCOUNTER — Ambulatory Visit (INDEPENDENT_AMBULATORY_CARE_PROVIDER_SITE_OTHER)
Admission: RE | Admit: 2017-11-22 | Discharge: 2017-11-22 | Disposition: A | Discharge status: Home / Self Care | Payer: 59 | Source: Ambulatory Visit | Attending: Family Medicine | Admitting: Family Medicine

## 2017-11-22 DIAGNOSIS — Z13820 Encounter for screening for osteoporosis: Secondary | ICD-10-CM

## 2017-12-02 ENCOUNTER — Encounter: Payer: Self-pay | Admitting: Family Medicine

## 2018-05-09 ENCOUNTER — Ambulatory Visit: Payer: 59 | Admitting: Family Medicine

## 2018-05-30 ENCOUNTER — Ambulatory Visit: Payer: Self-pay | Admitting: Family Medicine

## 2018-05-30 ENCOUNTER — Telehealth: Payer: Self-pay | Admitting: Family Medicine

## 2018-05-30 NOTE — Telephone Encounter (Signed)
Tried to call Patient to reschedule appt, She had a 9am appt but we had a 2 hour delay because of the snow and didn't open until 10am. Had to leave a message

## 2018-07-16 ENCOUNTER — Other Ambulatory Visit: Payer: Self-pay | Admitting: Family Medicine

## 2018-07-16 DIAGNOSIS — E559 Vitamin D deficiency, unspecified: Secondary | ICD-10-CM

## 2018-10-17 ENCOUNTER — Encounter: Payer: Self-pay | Admitting: Family Medicine

## 2018-10-17 ENCOUNTER — Ambulatory Visit: Payer: BC Managed Care – PPO | Admitting: Family Medicine

## 2018-10-17 VITALS — BP 122/80 | HR 65 | Ht 66.0 in | Wt 224.5 lb

## 2018-10-17 DIAGNOSIS — E119 Type 2 diabetes mellitus without complications: Secondary | ICD-10-CM | POA: Diagnosis not present

## 2018-10-17 DIAGNOSIS — E559 Vitamin D deficiency, unspecified: Secondary | ICD-10-CM | POA: Diagnosis not present

## 2018-10-17 DIAGNOSIS — Z91199 Patient's noncompliance with other medical treatment and regimen due to unspecified reason: Secondary | ICD-10-CM

## 2018-10-17 DIAGNOSIS — I10 Essential (primary) hypertension: Secondary | ICD-10-CM | POA: Diagnosis not present

## 2018-10-17 DIAGNOSIS — Z0001 Encounter for general adult medical examination with abnormal findings: Secondary | ICD-10-CM

## 2018-10-17 DIAGNOSIS — Z9119 Patient's noncompliance with other medical treatment and regimen: Secondary | ICD-10-CM

## 2018-10-17 LAB — URINALYSIS, ROUTINE W REFLEX MICROSCOPIC
Bilirubin Urine: NEGATIVE
Ketones, ur: NEGATIVE
Nitrite: NEGATIVE
Specific Gravity, Urine: >=1.03 — AB (ref 1.000–1.030)
Total Protein, Urine: NEGATIVE
Urine Glucose: NEGATIVE
Urobilinogen, UA: 0.2 (ref 0.0–1.0)
pH: 5.5 (ref 5.0–8.0)

## 2018-10-17 LAB — COMPREHENSIVE METABOLIC PANEL
ALT: 17 U/L (ref 0–35)
AST: 21 U/L (ref 0–37)
Albumin: 4 g/dL (ref 3.5–5.2)
Alkaline Phosphatase: 65 U/L (ref 39–117)
BUN: 23 mg/dL (ref 6–23)
CO2: 26 mEq/L (ref 19–32)
Calcium: 9.1 mg/dL (ref 8.4–10.5)
Chloride: 108 mEq/L (ref 96–112)
Creatinine, Ser: 1.14 mg/dL (ref 0.40–1.20)
GFR: 57.28 mL/min — ABNORMAL LOW (ref >60.00)
Glucose, Bld: 98 mg/dL (ref 70–99)
Potassium: 3.9 mEq/L (ref 3.5–5.1)
Sodium: 142 mEq/L (ref 135–145)
Total Bilirubin: 0.6 mg/dL (ref 0.2–1.2)
Total Protein: 6.8 g/dL (ref 6.0–8.3)

## 2018-10-17 LAB — LIPID PANEL
Cholesterol: 148 mg/dL (ref 0–200)
HDL: 57.3 mg/dL (ref >39.00)
LDL Cholesterol: 76 mg/dL (ref 0–99)
NonHDL: 91.07
Total CHOL/HDL Ratio: 3
Triglycerides: 76 mg/dL (ref 0.0–149.0)
VLDL: 15.2 mg/dL (ref 0.0–40.0)

## 2018-10-17 LAB — CBC
HCT: 40.8 % (ref 36.0–46.0)
Hemoglobin: 13.4 g/dL (ref 12.0–15.0)
MCHC: 32.8 g/dL (ref 30.0–36.0)
MCV: 87.1 fl (ref 78.0–100.0)
Platelets: 303 10*3/uL (ref 150.0–400.0)
RBC: 4.69 Mil/uL (ref 3.87–5.11)
RDW: 15.5 % (ref 11.5–15.5)
WBC: 4.5 10*3/uL (ref 4.0–10.5)

## 2018-10-17 LAB — HEMOGLOBIN A1C: Hgb A1c MFr Bld: 6.7 % — ABNORMAL HIGH (ref 4.6–6.5)

## 2018-10-17 LAB — MICROALBUMIN / CREATININE URINE RATIO
Creatinine,U: 140.5 mg/dL
Microalb Creat Ratio: 0.5 mg/g (ref 0.0–30.0)
Microalb, Ur: <0.7 mg/dL (ref 0.0–1.9)

## 2018-10-17 LAB — VITAMIN D 25 HYDROXY (VIT D DEFICIENCY, FRACTURES): VITD: 22.76 ng/mL — ABNORMAL LOW (ref 30.00–100.00)

## 2018-10-17 LAB — LDL CHOLESTEROL, DIRECT: Direct LDL: 85 mg/dL

## 2018-10-17 MED ORDER — LISINOPRIL-HYDROCHLOROTHIAZIDE 10-12.5 MG PO TABS: 1.0000 | ORAL_TABLET | Freq: Every day | ORAL | 0 refills | Status: DC

## 2018-10-17 MED ORDER — METFORMIN HCL ER 500 MG PO TB24: 500.0000 mg | ORAL_TABLET | Freq: Every day | ORAL | 0 refills | Status: DC

## 2018-10-17 NOTE — Patient Instructions (Signed)
Managing Your Hypertension Hypertension is commonly called high blood pressure. This is when the force of your blood pressing against the walls of your arteries is too strong. Arteries are blood vessels that carry blood from your heart throughout your body. Hypertension forces the heart to work harder to pump blood, and may cause the arteries to become narrow or stiff. Having untreated or uncontrolled hypertension can cause heart attack, stroke, kidney disease, and other problems. What are blood pressure readings? A blood pressure reading consists of a higher number over a lower number. Ideally, your blood pressure should be below 120/80. The first ("top") number is called the systolic pressure. It is a measure of the pressure in your arteries as your heart beats. The second ("bottom") number is called the diastolic pressure. It is a measure of the pressure in your arteries as the heart relaxes. What does my blood pressure reading mean? Blood pressure is classified into four stages. Based on your blood pressure reading, your health care provider may use the following stages to determine what type of treatment you need, if any. Systolic pressure and diastolic pressure are measured in a unit called mm Hg. Normal  Systolic pressure: below 784.  Diastolic pressure: below 80. Elevated  Systolic pressure: 696-295.  Diastolic pressure: below 80. Hypertension stage 1  Systolic pressure: 284-132.  Diastolic pressure: 44-01. Hypertension stage 2  Systolic pressure: 027 or above.  Diastolic pressure: 90 or above. What health risks are associated with hypertension? Managing your hypertension is an important responsibility. Uncontrolled hypertension can lead to:  A heart attack.  A stroke.  A weakened blood vessel (aneurysm).  Heart failure.  Kidney damage.  Eye damage.  Metabolic syndrome.  Memory and concentration problems. What changes can I make to manage my hypertension?  Hypertension can be managed by making lifestyle changes and possibly by taking medicines. Your health care provider will help you make a plan to bring your blood pressure within a normal range. Eating and drinking   Eat a diet that is high in fiber and potassium, and low in salt (sodium), added sugar, and fat. An example eating plan is called the DASH (Dietary Approaches to Stop Hypertension) diet. To eat this way: ? Eat plenty of fresh fruits and vegetables. Try to fill half of your plate at each meal with fruits and vegetables. ? Eat whole grains, such as whole wheat pasta, brown rice, or whole grain bread. Fill about one quarter of your plate with whole grains. ? Eat low-fat diary products. ? Avoid fatty cuts of meat, processed or cured meats, and poultry with skin. Fill about one quarter of your plate with lean proteins such as fish, chicken without skin, beans, eggs, and tofu. ? Avoid premade and processed foods. These tend to be higher in sodium, added sugar, and fat.  Reduce your daily sodium intake. Most people with hypertension should eat less than 1,500 mg of sodium a day.  Limit alcohol intake to no more than 1 drink a day for nonpregnant women and 2 drinks a day for men. One drink equals 12 oz of beer, 5 oz of wine, or 1 oz of hard liquor. Lifestyle  Work with your health care provider to maintain a healthy body weight, or to lose weight. Ask what an ideal weight is for you.  Get at least 30 minutes of exercise that causes your heart to beat faster (aerobic exercise) most days of the week. Activities may include walking, swimming, or biking.  Include exercise  to strengthen your muscles (resistance exercise), such as weight lifting, as part of your weekly exercise routine. Try to do these types of exercises for 30 minutes at least 3 days a week.  Do not use any products that contain nicotine or tobacco, such as cigarettes and e-cigarettes. If you need help quitting, ask your health  care provider.  Control any long-term (chronic) conditions you have, such as high cholesterol or diabetes. Monitoring  Monitor your blood pressure at home as told by your health care provider. Your personal target blood pressure may vary depending on your medical conditions, your age, and other factors.  Have your blood pressure checked regularly, as often as told by your health care provider. Working with your health care provider  Review all the medicines you take with your health care provider because there may be side effects or interactions.  Talk with your health care provider about your diet, exercise habits, and other lifestyle factors that may be contributing to hypertension.  Visit your health care provider regularly. Your health care provider can help you create and adjust your plan for managing hypertension. Will I need medicine to control my blood pressure? Your health care provider may prescribe medicine if lifestyle changes are not enough to get your blood pressure under control, and if:  Your systolic blood pressure is 130 or higher.  Your diastolic blood pressure is 80 or higher. Take medicines only as told by your health care provider. Follow the directions carefully. Blood pressure medicines must be taken as prescribed. The medicine does not work as well when you skip doses. Skipping doses also puts you at risk for problems. Contact a health care provider if:  You think you are having a reaction to medicines you have taken.  You have repeated (recurrent) headaches.  You feel dizzy.  You have swelling in your ankles.  You have trouble with your vision. Get help right away if:  You develop a severe headache or confusion.  You have unusual weakness or numbness, or you feel faint.  You have severe pain in your chest or abdomen.  You vomit repeatedly.  You have trouble breathing. Summary  Hypertension is when the force of blood pumping through your arteries  is too strong. If this condition is not controlled, it may put you at risk for serious complications.  Your personal target blood pressure may vary depending on your medical conditions, your age, and other factors. For most people, a normal blood pressure is less than 120/80.  Hypertension is managed by lifestyle changes, medicines, or both. Lifestyle changes include weight loss, eating a healthy, low-sodium diet, exercising more, and limiting alcohol. This information is not intended to replace advice given to you by your health care provider. Make sure you discuss any questions you have with your health care provider. Document Released: 12/19/2011 Document Revised: 07/18/2018 Document Reviewed: 02/22/2016 Elsevier Patient Education  2020 [place] With Diabetes Diabetes (type 1 diabetes mellitus or type 2 diabetes mellitus) is a condition in which the body does not have enough of a hormone called insulin, or the body does not respond properly to insulin. Normally, insulin allows sugars (glucose) to enter cells in the body. The cells use glucose for energy. With diabetes, extra glucose builds up in the blood instead of going into cells, which results in high blood glucose (hyperglycemia). How to manage lifestyle changes Managing diabetes includes medical treatments as well as lifestyle changes. If diabetes is not managed well, serious physical  and emotional complications can occur. Taking good care of yourself means that you are responsible for:  Monitoring glucose regularly.  Eating a healthy diet.  Exercising regularly.  Meeting with health care providers.  Taking medicines as directed. Some people may feel a lot of stress about managing their diabetes. This is known as emotional distress, and it is very common. Living with diabetes can place you at risk for emotional distress, depression, or anxiety. These disorders can be confusing and can make diabetes management more  difficult. How to recognize stress Emotional distress Symptoms of emotional distress include:  Anger about having a diagnosis of diabetes.  Fear or frustration about your diagnosis and the changes you need to make to manage the condition.  Being overly worried about the care that you need or the cost of the care that you need.  Feeling like you caused your condition by doing something wrong.  Fear of unpredictable situations, like low or high blood glucose.  Feeling judged by your health care providers.  Feeling very alone with the disease.  Getting too tired or worn out with the demands of daily care. Depression Having diabetes means that you are at a higher risk for depression. Having depression also means that you are at a higher risk for diabetes. Your health care provider may test (screen) you for symptoms of depression. It is important to recognize depression symptoms and to start treatment for depression soon after it is diagnosed. The following are some symptoms of depression:  Loss of interest in things that you used to enjoy.  Trouble sleeping, or often waking up early and not being able to get back to sleep.  A change in appetite.  Feeling tired most of the day.  Feeling nervous and anxious.  Feeling guilty and worrying that you are a burden to others.  Feeling depressed more often than you do not feel that way.  Thoughts of hurting yourself or feeling that you want to die. If you have any of these symptoms for 2 weeks or longer, reach out to a health care provider. Follow these instructions at home: Managing emotional distress The following are some ways to manage emotional distress:  Talk with your health care provider or certified diabetes educator. Consider working with a counselor or therapist.  Learn as much as you can about diabetes and its treatment. Meet with a certified diabetes educator or take a class to learn how to manage your condition.  Keep a  journal of your thoughts and concerns.  Accept that some things are out of your control.  Talk with other people who have diabetes. It can help to talk with others about the emotional distress that you feel.  Find ways to manage stress that work for you. These may include art or music therapy, exercise, meditation, and hobbies.  Seek support from spiritual leaders, family, and friends. General instructions  Follow your diabetes management plan.  Keep all follow-up visits as told by your health care provider. This is important. Where to find support   Ask your health care provider to recommend a therapist who understands both depression and diabetes.  Search for information and support from the American Diabetes Association: www.diabetes.org  Find a certified diabetes educator and make an appointment through [facility]: www.diabeteseducator.org Get help right away if:  You have thoughts about hurting yourself or others. If you ever feel like you may hurt yourself or others, or have thoughts about taking your own life,  get help right away. You can go to your nearest emergency department or call:  Your local emergency services (911 in the [place]  A suicide crisis helpline, such as the [facility] at 260-710-3895. This is open 24 hours a day. Summary  Diabetes (type 1 diabetes mellitus or type 2 diabetes mellitus) is a condition in which the body does not have enough of a hormone called insulin, or the body does not respond properly to insulin.  Living with diabetes puts you at risk for medical issues, and it also puts you at risk for emotional issues such as emotional distress, depression, and anxiety.  Recognizing the symptoms of emotional distress and depression may help you avoid problems with your diabetes control. It is important to start treatment for emotional distress and depression soon after they are diagnosed.   Having diabetes means that you are at a higher risk for depression. Ask your health care provider to recommend a therapist who understands both depression and diabetes.  If you experience symptoms of emotional distress or depression, it is important to discuss this with your health care provider, certified diabetes educator, or therapist. This information is not intended to replace advice given to you by your health care provider. Make sure you discuss any questions you have with your health care provider. Document Released: 08/09/2016 Document Revised: 04/07/2018 Document Reviewed: 08/09/2016 Elsevier Patient Education  2020 [employer].

## 2018-10-17 NOTE — Progress Notes (Signed)
Established Patient Office Visit  Subjective:  Patient ID: Ann Mcguire, female    DOB: 1950/06/06  Age: 68 y.o. MRN: [id number]  CC:  Chief Complaint  Patient presents with  . Follow-up    HPI Ann Mcguire presents for follow-up of her hypertension diabetes and vitamin D deficiency.  Patient lost to follow-up for almost a year now.  Not sure that she has been taking her medicines and we will start over again.  Past due for GYN care.  She is scheduled to see the dentist.  No recent eye care or colonoscopy.  She is fasting today.  She has been furloughed from her job but is now back at work.  Past Medical History:  Diagnosis Date  . Chicken pox   . Fainting spell   . Hypertension     History reviewed. No pertinent surgical history.  Family History  Problem Relation Age of Onset  . Hypertension Sister     Social History   Socioeconomic History  . Marital status: Unknown    Spouse name: Not on file  . Number of children: Not on file  . Years of education: Not on file  . Highest education level: Not on file  Occupational History  . Not on file  Social Needs  . Financial resource strain: Not on file  . Food insecurity    Worry: Not on file    Inability: Not on file  . Transportation needs    Medical: Not on file    Non-medical: Not on file  Tobacco Use  . Smoking status: Never Smoker  . Smokeless tobacco: Never Used  Substance and Sexual Activity  . Alcohol use: No    Frequency: Never  . Drug use: No  . Sexual activity: Not on file  Lifestyle  . Physical activity    Days per week: Not on file    Minutes per session: Not on file  . Stress: Not on file  Relationships  . Social Herbalist on phone: Not on file    Gets together: Not on file    Attends religious service: Not on file    Active member of club or organization: Not on file    Attends meetings of clubs or organizations: Not on file    Relationship status: Not on file  .  Intimate partner violence    Fear of current or ex partner: Not on file    Emotionally abused: Not on file    Physically abused: Not on file    Forced sexual activity: Not on file  Other Topics Concern  . Not on file  Social History Narrative  . Not on file    Outpatient Medications Prior to Visit  Medication Sig Dispense Refill  . Vitamin D, Ergocalciferol, (DRISDOL) 1.25 MG (50000 UT) CAPS capsule TAKE ONE CAPSULE BY MOUTH EVERY 7 DAYS 15 capsule 0  . lisinopril-hydrochlorothiazide (PRINZIDE,ZESTORETIC) 10-12.5 MG tablet Take 1 tablet by mouth daily. 90 tablet 1  . metFORMIN (GLUCOPHAGE-XR) 500 MG 24 hr tablet Take 1 tablet (500 mg total) by mouth at bedtime. 90 tablet 1  . azithromycin (ZITHROMAX) 250 MG tablet Take 2 today and then one each day until finished. (Patient not taking: Reported on 11/11/2017) 6 tablet 0   No facility-administered medications prior to visit.     No Known Allergies  ROS Review of Systems  Constitutional: Negative.   HENT: Negative.   Eyes: Negative for photophobia and visual disturbance.  Respiratory: Negative.   Cardiovascular: Negative.   Gastrointestinal: Negative.   Endocrine: Negative for polyphagia and polyuria.  Genitourinary: Negative.   Musculoskeletal: Negative.   Skin: Negative for color change and pallor.  Allergic/Immunologic: Negative for immunocompromised state.  Neurological: Negative for seizures, speech difficulty and numbness.  Hematological: Does not bruise/bleed easily.  Psychiatric/Behavioral: Negative.       Objective:    Physical Exam  Constitutional: She is oriented to person, place, and time. She appears well-developed and well-nourished. No distress.  HENT:  Head: Normocephalic and atraumatic.  Right Ear: External ear normal.  Left Ear: External ear normal.  Mouth/Throat: Oropharynx is clear and moist. No oropharyngeal exudate.  Eyes: Pupils are equal, round, and reactive to light. Conjunctivae and EOM are  normal. Right eye exhibits no discharge. Left eye exhibits no discharge. No scleral icterus.  Neck: No JVD present. No tracheal deviation present. No thyromegaly present.  Cardiovascular: Normal rate, regular rhythm and normal heart sounds.  Pulmonary/Chest: Effort normal and breath sounds normal. No stridor.  Abdominal: Bowel sounds are normal. She exhibits no distension. There is no abdominal tenderness. There is no rebound and no guarding.  Musculoskeletal:        General: No edema.  Lymphadenopathy:    She has no cervical adenopathy.  Neurological: She is oriented to person, place, and time.  Skin: Skin is warm and dry. She is not diaphoretic.  Psychiatric: She has a normal mood and affect. Her behavior is normal.    BP 122/80   Pulse 65   Ht 5\' 6"  (1.676 m)   Wt 224 lb 8 oz (101.8 kg)   SpO2 97%   BMI 36.24 kg/m  Wt Readings from Last 3 Encounters:  10/17/18 224 lb 8 oz (101.8 kg)  11/11/17 222 lb 12.8 oz (101.1 kg)  11/01/17 219 lb 4 oz (99.5 kg)   BP Readings from Last 3 Encounters:  10/17/18 122/80  11/11/17 (!) 142/100  11/01/17 128/80   Guideline developer:  UpToDate (see UpToDate for funding source) Date Released: June 2014  Health Maintenance Due  Topic Date Due  . FOOT EXAM  05/11/1960  . OPHTHALMOLOGY EXAM  05/11/1960  . TETANUS/TDAP  05/11/1969  . COLONOSCOPY  04/20/2015  . PNA vac Low Risk Adult (1 of 2 - PCV13) 05/12/2015  . HEMOGLOBIN A1C  05/04/2018    There are no preventive care reminders to display for this patient.  Lab Results  Component Value Date   TSH 2.40 03/22/2017   Lab Results  Component Value Date   WBC 3.4 (L) 11/01/2017   HGB 13.2 11/01/2017   HCT 39.8 11/01/2017   MCV 86.0 11/01/2017   PLT 340.0 11/01/2017   Lab Results  Component Value Date   NA 141 11/01/2017   K 4.0 11/01/2017   CO2 29 11/01/2017   GLUCOSE 108 (H) 11/01/2017   BUN 14 11/01/2017   CREATININE 1.12 11/01/2017   BILITOT 0.5 11/01/2017   ALKPHOS 54  11/01/2017   AST 16 11/01/2017   ALT 13 11/01/2017   PROT 7.0 11/01/2017   ALBUMIN 3.8 11/01/2017   CALCIUM 9.3 11/01/2017   GFR 62.31 11/01/2017   Lab Results  Component Value Date   CHOL 147 03/22/2017   Lab Results  Component Value Date   HDL 60.20 03/22/2017   Lab Results  Component Value Date   LDLCALC 79 03/22/2017   Lab Results  Component Value Date   TRIG 39.0 03/22/2017   Lab Results  Component  Value Date   CHOLHDL 2 03/22/2017   Lab Results  Component Value Date   HGBA1C 6.8 (H) 11/01/2017      Assessment & Plan:   Problem List Items Addressed This Visit      Cardiovascular and Mediastinum   Essential hypertension - Primary   Relevant Medications   lisinopril-hydrochlorothiazide (ZESTORETIC) 10-12.5 MG tablet   Other Relevant Orders   CBC   Comprehensive metabolic panel   Urinalysis, Routine w reflex microscopic   Microalbumin / creatinine urine ratio     Endocrine   Type 2 diabetes mellitus without complication, without long-term current use of insulin (HCC)   Relevant Medications   lisinopril-hydrochlorothiazide (ZESTORETIC) 10-12.5 MG tablet   metFORMIN (GLUCOPHAGE-XR) 500 MG 24 hr tablet   Other Relevant Orders   Comprehensive metabolic panel   Hemoglobin A1c   Urinalysis, Routine w reflex microscopic   Microalbumin / creatinine urine ratio     Other   Vitamin D deficiency   Relevant Orders   VITAMIN D 25 Hydroxy (Vit-D Deficiency, Fractures)   Medically noncompliant    Other Visit Diagnoses    Encounter for health maintenance examination with abnormal findings       Relevant Orders   LDL cholesterol, direct   Lipid panel   MM Digital Screening   Ambulatory referral to Gynecology   Ambulatory referral to Ophthalmology   Ambulatory referral to Gastroenterology      Meds ordered this encounter  Medications  . lisinopril-hydrochlorothiazide (ZESTORETIC) 10-12.5 MG tablet    Sig: Take 1 tablet by mouth daily.    Dispense:  90  tablet    Refill:  0  . metFORMIN (GLUCOPHAGE-XR) 500 MG 24 hr tablet    Sig: Take 1 tablet (500 mg total) by mouth at bedtime.    Dispense:  90 tablet    Refill:  0    Follow-up: Return in about 3 months (around 01/17/2019).   May need to restart vitamin D and adjust Glucophage dosage depending on blood work results.

## 2018-10-20 MED ORDER — VITAMIN D (ERGOCALCIFEROL) 1.25 MG (50000 UNIT) PO CAPS: ORAL_CAPSULE | ORAL | 5 refills | Status: DC

## 2018-10-20 NOTE — Addendum Note (Signed)
Addended by: Jon Billings on: 10/20/2018 10:24 AM   Modules accepted: Orders

## 2018-10-28 MED ORDER — HYDROCHLOROTHIAZIDE 25 MG PO TABS: 25.0000 mg | ORAL_TABLET | Freq: Every day | ORAL | 0 refills | Status: DC

## 2018-10-28 MED ORDER — LISINOPRIL 10 MG PO TABS: 10.0000 mg | ORAL_TABLET | Freq: Every day | ORAL | 0 refills | Status: DC

## 2018-10-28 NOTE — Addendum Note (Signed)
Addended by: Jon Billings on: 10/28/2018 03:29 PM   Modules accepted: Orders

## 2018-10-29 ENCOUNTER — Telehealth: Payer: Self-pay | Admitting: Family Medicine

## 2018-10-29 NOTE — Telephone Encounter (Signed)
Pt. Called and given lab results and instructions. Verbalizes understanding. Scheduled for repeat labs.

## 2018-11-18 ENCOUNTER — Encounter: Payer: Self-pay | Admitting: Family Medicine

## 2018-11-18 DIAGNOSIS — Z1231 Encounter for screening mammogram for malignant neoplasm of breast: Secondary | ICD-10-CM | POA: Diagnosis not present

## 2019-01-01 ENCOUNTER — Encounter: Payer: Self-pay | Admitting: Internal Medicine

## 2019-01-05 DIAGNOSIS — H2513 Age-related nuclear cataract, bilateral: Secondary | ICD-10-CM | POA: Diagnosis not present

## 2019-01-05 DIAGNOSIS — E119 Type 2 diabetes mellitus without complications: Secondary | ICD-10-CM | POA: Diagnosis not present

## 2019-01-30 ENCOUNTER — Other Ambulatory Visit: Payer: Self-pay

## 2019-01-30 ENCOUNTER — Other Ambulatory Visit (INDEPENDENT_AMBULATORY_CARE_PROVIDER_SITE_OTHER): Payer: BC Managed Care – PPO

## 2019-01-30 DIAGNOSIS — E860 Dehydration: Secondary | ICD-10-CM

## 2019-01-30 LAB — COMPREHENSIVE METABOLIC PANEL
ALT: 16 U/L (ref 0–35)
AST: 21 U/L (ref 0–37)
Albumin: 4 g/dL (ref 3.5–5.2)
Alkaline Phosphatase: 61 U/L (ref 39–117)
BUN: 18 mg/dL (ref 6–23)
CO2: 27 mEq/L (ref 19–32)
Calcium: 9.7 mg/dL (ref 8.4–10.5)
Chloride: 104 mEq/L (ref 96–112)
Creatinine, Ser: 1.13 mg/dL (ref 0.40–1.20)
GFR: 57.82 mL/min — ABNORMAL LOW (ref >60.00)
Glucose, Bld: 98 mg/dL (ref 70–99)
Potassium: 3.6 mEq/L (ref 3.5–5.1)
Sodium: 140 mEq/L (ref 135–145)
Total Bilirubin: 0.6 mg/dL (ref 0.2–1.2)
Total Protein: 7.2 g/dL (ref 6.0–8.3)

## 2019-02-08 ENCOUNTER — Encounter: Payer: Self-pay | Admitting: Family Medicine

## 2019-02-09 ENCOUNTER — Encounter: Payer: Self-pay | Admitting: Family Medicine

## 2019-03-16 ENCOUNTER — Encounter: Payer: Self-pay | Admitting: Family Medicine

## 2019-03-16 ENCOUNTER — Other Ambulatory Visit: Payer: Self-pay

## 2019-03-16 DIAGNOSIS — I10 Essential (primary) hypertension: Secondary | ICD-10-CM

## 2019-03-16 DIAGNOSIS — E119 Type 2 diabetes mellitus without complications: Secondary | ICD-10-CM

## 2019-03-16 MED ORDER — LISINOPRIL-HYDROCHLOROTHIAZIDE 10-12.5 MG PO TABS: 1.0000 | ORAL_TABLET | Freq: Every day | ORAL | 0 refills | Status: DC

## 2019-03-16 MED ORDER — METFORMIN HCL ER 500 MG PO TB24: 500.0000 mg | ORAL_TABLET | Freq: Every day | ORAL | 0 refills | Status: DC

## 2019-03-21 ENCOUNTER — Encounter: Payer: Self-pay | Admitting: Family Medicine

## 2019-03-23 NOTE — Telephone Encounter (Signed)
Pt sent another mychart message about this. Addressed it in that message.

## 2019-03-24 ENCOUNTER — Ambulatory Visit: Payer: BC Managed Care – PPO | Admitting: Family Medicine

## 2019-04-07 ENCOUNTER — Telehealth (INDEPENDENT_AMBULATORY_CARE_PROVIDER_SITE_OTHER): Payer: BC Managed Care – PPO | Admitting: Family Medicine

## 2019-04-07 ENCOUNTER — Encounter: Payer: Self-pay | Admitting: Family Medicine

## 2019-04-07 ENCOUNTER — Other Ambulatory Visit: Payer: Self-pay

## 2019-04-07 VITALS — BP 180/94 | HR 54 | Ht 66.0 in | Wt 221.0 lb

## 2019-04-07 DIAGNOSIS — I10 Essential (primary) hypertension: Secondary | ICD-10-CM

## 2019-04-07 DIAGNOSIS — E7800 Pure hypercholesterolemia, unspecified: Secondary | ICD-10-CM

## 2019-04-07 DIAGNOSIS — E119 Type 2 diabetes mellitus without complications: Secondary | ICD-10-CM

## 2019-04-07 DIAGNOSIS — E559 Vitamin D deficiency, unspecified: Secondary | ICD-10-CM

## 2019-04-07 NOTE — Progress Notes (Signed)
Once her blood pressure develops she been off her medicines   Established Patient Office Visit  Subjective:  Patient ID: Ann Mcguire, female    DOB: Feb 18, 1951  Age: 68 y.o. MRN: [id number]  CC:  Chief Complaint  Patient presents with  . Medication Refill    refill on medication, no concerns at this time    HPI SHAMIA UPPAL presents for for follow-up of her blood pressure, diabetes vitamin D deficiency.  Patient's daughter is a Marine scientist and has been following her blood pressure.  Daughter says that the blood sugar has been okay.  Today's blood pressure the patient reported from home is rather high.  Patient has been taking her medicines without issue.  She does continue to take the Metformin for her diabetes.  She has been taking high-dose vitamin D.  Past Medical History:  Diagnosis Date  . Chicken pox   . Fainting spell   . Hypertension     History reviewed. No pertinent surgical history.  Family History  Problem Relation Age of Onset  . Hypertension Sister     Social History   Socioeconomic History  . Marital status: Unknown    Spouse name: Not on file  . Number of children: Not on file  . Years of education: Not on file  . Highest education level: Not on file  Occupational History  . Not on file  Tobacco Use  . Smoking status: Never Smoker  . Smokeless tobacco: Never Used  Substance and Sexual Activity  . Alcohol use: No  . Drug use: No  . Sexual activity: Not on file  Other Topics Concern  . Not on file  Social History Narrative  . Not on file   Social Determinants of Health   Financial Resource Strain:   . Difficulty of Paying Living Expenses: Not on file  Food Insecurity:   . Worried About Charity fundraiser in the Last Year: Not on file  . Ran Out of Food in the Last Year: Not on file  Transportation Needs:   . Lack of Transportation (Medical): Not on file  . Lack of Transportation (Non-Medical): Not on file  Physical Activity:   . Days  of Exercise per Week: Not on file  . Minutes of Exercise per Session: Not on file  Stress:   . Feeling of Stress : Not on file  Social Connections:   . Frequency of Communication with Friends and Family: Not on file  . Frequency of Social Gatherings with Friends and Family: Not on file  . Attends Religious Services: Not on file  . Active Member of Clubs or Organizations: Not on file  . Attends Archivist Meetings: Not on file  . Marital Status: Not on file  Intimate Partner Violence:   . Fear of Current or Ex-Partner: Not on file  . Emotionally Abused: Not on file  . Physically Abused: Not on file  . Sexually Abused: Not on file    Outpatient Medications Prior to Visit  Medication Sig Dispense Refill  . lisinopril-hydrochlorothiazide (ZESTORETIC) 10-12.5 MG tablet Take 1 tablet by mouth daily. 30 tablet 0  . metFORMIN (GLUCOPHAGE-XR) 500 MG 24 hr tablet Take 1 tablet (500 mg total) by mouth at bedtime. 30 tablet 0  . Vitamin D, Ergocalciferol, (DRISDOL) 1.25 MG (50000 UT) CAPS capsule TAKE ONE CAPSULE BY MOUTH EVERY 7 DAYS 5 capsule 5  . hydrochlorothiazide (HYDRODIURIL) 25 MG tablet Take 1 tablet (25 mg total) by mouth daily. (Patient  not taking: Reported on 04/07/2019) 90 tablet 0  . lisinopril (ZESTRIL) 10 MG tablet Take 1 tablet (10 mg total) by mouth daily. (Patient not taking: Reported on 04/07/2019) 90 tablet 0   No facility-administered medications prior to visit.    No Known Allergies  ROS Review of Systems  Constitutional: Negative.   Respiratory: Negative.   Cardiovascular: Negative.   Gastrointestinal: Negative.   Neurological: Negative for headaches.  Psychiatric/Behavioral: Negative.       Objective:    Physical Exam  Constitutional: She is oriented to person, place, and time. She appears well-developed and well-nourished. No distress.  HENT:  Head: Normocephalic and atraumatic.  Neck: No JVD present. No tracheal deviation present.    Pulmonary/Chest: Effort normal. No stridor.  Neurological: She is alert and oriented to person, place, and time.  Skin: She is not diaphoretic.  Psychiatric: She has a normal mood and affect. Her behavior is normal.    BP (!) 180/94 Comment: per pt  Pulse (!) 54 Comment: per pt  Ht 5' 6"  (1.676 m)   Wt 221 lb (100.2 kg) Comment: per pt  BMI 35.67 kg/m  Wt Readings from Last 3 Encounters:  04/07/19 221 lb (100.2 kg)  10/17/18 224 lb 8 oz (101.8 kg)  11/11/17 222 lb 12.8 oz (101.1 kg)     Health Maintenance Due  Topic Date Due  . TETANUS/TDAP  05/11/1969  . COLONOSCOPY  04/20/2015  . PNA vac Low Risk Adult (1 of 2 - PCV13) 05/12/2015  . INFLUENZA VACCINE  11/08/2018    There are no preventive care reminders to display for this patient.  Lab Results  Component Value Date   TSH 2.40 03/22/2017   Lab Results  Component Value Date   WBC 4.5 10/17/2018   HGB 13.4 10/17/2018   HCT 40.8 10/17/2018   MCV 87.1 10/17/2018   PLT 303.0 10/17/2018   Lab Results  Component Value Date   NA 140 01/30/2019   K 3.6 01/30/2019   CO2 27 01/30/2019   GLUCOSE 98 01/30/2019   BUN 18 01/30/2019   CREATININE 1.13 01/30/2019   BILITOT 0.6 01/30/2019   ALKPHOS 61 01/30/2019   AST 21 01/30/2019   ALT 16 01/30/2019   PROT 7.2 01/30/2019   ALBUMIN 4.0 01/30/2019   CALCIUM 9.7 01/30/2019   GFR 57.82 (L) 01/30/2019   Lab Results  Component Value Date   CHOL 148 10/17/2018   Lab Results  Component Value Date   HDL 57.30 10/17/2018   Lab Results  Component Value Date   LDLCALC 76 10/17/2018   Lab Results  Component Value Date   TRIG 76.0 10/17/2018   Lab Results  Component Value Date   CHOLHDL 3 10/17/2018   Lab Results  Component Value Date   HGBA1C 6.7 (H) 10/17/2018      Assessment & Plan:   Problem List Items Addressed This Visit      Cardiovascular and Mediastinum   Essential hypertension - Primary   Relevant Orders   CBC   Comp Met (CMET)   Urinalysis,  Routine w reflex microscopic   Urine Microalbumin w/creat. ratio     Endocrine   Type 2 diabetes mellitus without complication, without long-term current use of insulin (HCC)   Relevant Orders   HgB A1c   Urinalysis, Routine w reflex microscopic   Urine Microalbumin w/creat. ratio     Other   Vitamin D deficiency   Relevant Orders   VITAMIN D 25 Hydroxy (Vit-D  Deficiency, Fractures)    Other Visit Diagnoses    Elevated cholesterol       Relevant Orders   Direct LDL   Lipid Profile      No orders of the defined types were placed in this encounter.   Follow-up: Return in about 1 month (around 05/08/2019), or will return fasting for blood work and.   Patient will return fasting for above ordered blood work and again in a month for live follow-up with a list of the blood pressures her daughter has been measuring for her. Libby Maw, MD   Virtual Visit via Video Note  I connected with [name] on 04/07/19 at  9:30 AM EST by a video enabled telemedicine application and verified that I am speaking with the correct person using two identifiers.  Location: Patient: home alone.  Provider:   I discussed the limitations of evaluation and management by telemedicine and the availability of in person appointments. The patient expressed understanding and agreed to proceed.  History of Present Illness:    Observations/Objective:   Assessment and Plan:   Follow Up Instructions:    I discussed the assessment and treatment plan with the patient. The patient was provided an opportunity to ask questions and all were answered. The patient agreed with the plan and demonstrated an understanding of the instructions.   The patient was advised to call back or seek an in-person evaluation if the symptoms worsen or if the condition fails to improve as anticipated.  I provided 20 minutes of non-face-to-face time during this encounter.   Libby Maw, MD

## 2019-04-11 ENCOUNTER — Other Ambulatory Visit: Payer: Self-pay | Admitting: Family Medicine

## 2019-04-11 DIAGNOSIS — E119 Type 2 diabetes mellitus without complications: Secondary | ICD-10-CM

## 2019-04-11 DIAGNOSIS — I10 Essential (primary) hypertension: Secondary | ICD-10-CM

## 2019-04-14 NOTE — Telephone Encounter (Signed)
Attempted to reach patient. No answer. Vm left to call back. Pt needs an office visit

## 2019-04-15 NOTE — Telephone Encounter (Signed)
Called no answer, LMTCB.

## 2019-04-16 NOTE — Telephone Encounter (Signed)
Called to inform pt that she will need to come in for OV to f/u on medications before refill. No answer LMTCB

## 2019-05-08 ENCOUNTER — Other Ambulatory Visit: Payer: Self-pay

## 2019-05-08 ENCOUNTER — Encounter: Payer: Self-pay | Admitting: Family Medicine

## 2019-05-08 ENCOUNTER — Ambulatory Visit (INDEPENDENT_AMBULATORY_CARE_PROVIDER_SITE_OTHER): Payer: BC Managed Care – PPO | Admitting: Family Medicine

## 2019-05-08 VITALS — BP 126/84 | HR 69 | Temp 96.5°F | Ht 66.0 in | Wt 218.2 lb

## 2019-05-08 DIAGNOSIS — E559 Vitamin D deficiency, unspecified: Secondary | ICD-10-CM | POA: Diagnosis not present

## 2019-05-08 DIAGNOSIS — I10 Essential (primary) hypertension: Secondary | ICD-10-CM | POA: Diagnosis not present

## 2019-05-08 DIAGNOSIS — E119 Type 2 diabetes mellitus without complications: Secondary | ICD-10-CM

## 2019-05-08 DIAGNOSIS — E7800 Pure hypercholesterolemia, unspecified: Secondary | ICD-10-CM

## 2019-05-08 LAB — LIPID PANEL
Cholesterol: 155 mg/dL (ref 0–200)
HDL: 58.4 mg/dL (ref >39.00)
LDL Cholesterol: 84 mg/dL (ref 0–99)
NonHDL: 97
Total CHOL/HDL Ratio: 3
Triglycerides: 63 mg/dL (ref 0.0–149.0)
VLDL: 12.6 mg/dL (ref 0.0–40.0)

## 2019-05-08 LAB — COMPREHENSIVE METABOLIC PANEL
ALT: 15 U/L (ref 0–35)
AST: 18 U/L (ref 0–37)
Albumin: 4.2 g/dL (ref 3.5–5.2)
Alkaline Phosphatase: 63 U/L (ref 39–117)
BUN: 27 mg/dL — ABNORMAL HIGH (ref 6–23)
CO2: 27 mEq/L (ref 19–32)
Calcium: 9.9 mg/dL (ref 8.4–10.5)
Chloride: 102 mEq/L (ref 96–112)
Creatinine, Ser: 1.18 mg/dL (ref 0.40–1.20)
GFR: 54.95 mL/min — ABNORMAL LOW (ref >60.00)
Glucose, Bld: 90 mg/dL (ref 70–99)
Potassium: 3.8 mEq/L (ref 3.5–5.1)
Sodium: 138 mEq/L (ref 135–145)
Total Bilirubin: 0.6 mg/dL (ref 0.2–1.2)
Total Protein: 7.2 g/dL (ref 6.0–8.3)

## 2019-05-08 LAB — MICROALBUMIN / CREATININE URINE RATIO
Creatinine,U: 193.9 mg/dL
Microalb Creat Ratio: 0.6 mg/g (ref 0.0–30.0)
Microalb, Ur: 1.2 mg/dL (ref 0.0–1.9)

## 2019-05-08 LAB — CBC
HCT: 40.6 % (ref 36.0–46.0)
Hemoglobin: 13.5 g/dL (ref 12.0–15.0)
MCHC: 33.2 g/dL (ref 30.0–36.0)
MCV: 85.7 fl (ref 78.0–100.0)
Platelets: 364 10*3/uL (ref 150.0–400.0)
RBC: 4.74 Mil/uL (ref 3.87–5.11)
RDW: 15.1 % (ref 11.5–15.5)
WBC: 4.7 10*3/uL (ref 4.0–10.5)

## 2019-05-08 LAB — VITAMIN D 25 HYDROXY (VIT D DEFICIENCY, FRACTURES): VITD: 48.85 ng/mL (ref 30.00–100.00)

## 2019-05-08 LAB — URINALYSIS, ROUTINE W REFLEX MICROSCOPIC
Bilirubin Urine: NEGATIVE
Ketones, ur: NEGATIVE
Nitrite: NEGATIVE
Specific Gravity, Urine: >=1.03 — AB (ref 1.000–1.030)
Total Protein, Urine: NEGATIVE
Urine Glucose: NEGATIVE
Urobilinogen, UA: 0.2 (ref 0.0–1.0)
pH: 5 (ref 5.0–8.0)

## 2019-05-08 LAB — LDL CHOLESTEROL, DIRECT: Direct LDL: 87 mg/dL

## 2019-05-08 LAB — HEMOGLOBIN A1C: Hgb A1c MFr Bld: 6.6 % — ABNORMAL HIGH (ref 4.6–6.5)

## 2019-05-08 MED ORDER — METFORMIN HCL ER 500 MG PO TB24: ORAL_TABLET | ORAL | 0 refills | Status: DC

## 2019-05-08 MED ORDER — HYDROCHLOROTHIAZIDE 25 MG PO TABS: 25.0000 mg | ORAL_TABLET | Freq: Every day | ORAL | 0 refills | Status: DC

## 2019-05-08 MED ORDER — LISINOPRIL 10 MG PO TABS: 10.0000 mg | ORAL_TABLET | Freq: Every day | ORAL | 0 refills | Status: DC

## 2019-05-08 NOTE — Progress Notes (Addendum)
Established Patient Office Visit  Subjective:  Patient ID: Ann Mcguire, female    DOB: October 26, 1950  Age: 69 y.o. MRN: [id number]  CC:  Chief Complaint  Patient presents with  . Follow-up    follow up on BP and fasting labs, no concerns.     HPI Ann Mcguire presents for follow-up of her hypertension, diabetes and vitamin D deficiency.  Unfortunately she forgot to bring a list of her blood pressure medicines with her today.  Her daughter continues to check them and she is said that they have been okay.  Patient has been unable to check her blood sugars.  Continues to work.  They are maintaining social distance at work and wearing masks.  She is scheduled for Covid vaccine in March.   Past Medical History:  Diagnosis Date  . Chicken pox   . Fainting spell   . Hypertension     History reviewed. No pertinent surgical history.  Family History  Problem Relation Age of Onset  . Hypertension Sister     Social History   Socioeconomic History  . Marital status: Unknown    Spouse name: Not on file  . Number of children: Not on file  . Years of education: Not on file  . Highest education level: Not on file  Occupational History  . Not on file  Tobacco Use  . Smoking status: Never Smoker  . Smokeless tobacco: Never Used  Substance and Sexual Activity  . Alcohol use: No  . Drug use: No  . Sexual activity: Not on file  Other Topics Concern  . Not on file  Social History Narrative  . Not on file   Social Determinants of Health   Financial Resource Strain:   . Difficulty of Paying Living Expenses: Not on file  Food Insecurity:   . Worried About Charity fundraiser in the Last Year: Not on file  . Ran Out of Food in the Last Year: Not on file  Transportation Needs:   . Lack of Transportation (Medical): Not on file  . Lack of Transportation (Non-Medical): Not on file  Physical Activity:   . Days of Exercise per Week: Not on file  . Minutes of Exercise per  Session: Not on file  Stress:   . Feeling of Stress : Not on file  Social Connections:   . Frequency of Communication with Friends and Family: Not on file  . Frequency of Social Gatherings with Friends and Family: Not on file  . Attends Religious Services: Not on file  . Active Member of Clubs or Organizations: Not on file  . Attends Archivist Meetings: Not on file  . Marital Status: Not on file  Intimate Partner Violence:   . Fear of Current or Ex-Partner: Not on file  . Emotionally Abused: Not on file  . Physically Abused: Not on file  . Sexually Abused: Not on file    Outpatient Medications Prior to Visit  Medication Sig Dispense Refill  . Vitamin D, Ergocalciferol, (DRISDOL) 1.25 MG (50000 UT) CAPS capsule TAKE ONE CAPSULE BY MOUTH EVERY 7 DAYS 5 capsule 5  . lisinopril-hydrochlorothiazide (ZESTORETIC) 10-12.5 MG tablet TAKE 1 TABLET BY MOUTH DAILY 30 tablet 0  . metFORMIN (GLUCOPHAGE-XR) 500 MG 24 hr tablet TAKE 1 TABLET(500 MG) BY MOUTH AT BEDTIME 30 tablet 0  . hydrochlorothiazide (HYDRODIURIL) 25 MG tablet Take 1 tablet (25 mg total) by mouth daily. (Patient not taking: Reported on 04/07/2019) 90 tablet 0  .  lisinopril (ZESTRIL) 10 MG tablet Take 1 tablet (10 mg total) by mouth daily. (Patient not taking: Reported on 04/07/2019) 90 tablet 0   No facility-administered medications prior to visit.    No Known Allergies  ROS Review of Systems  Constitutional: Negative.   HENT: Negative.   Eyes: Negative for photophobia and visual disturbance.  Respiratory: Negative.   Cardiovascular: Negative.   Gastrointestinal: Negative.   Endocrine: Negative for polyphagia and polyuria.  Genitourinary: Negative.   Musculoskeletal: Negative for gait problem and joint swelling.  Skin: Negative for pallor and rash.  Neurological: Negative for light-headedness and headaches.  Hematological: Does not bruise/bleed easily.  Psychiatric/Behavioral: Negative.       Objective:      Physical Exam  Constitutional: She is oriented to person, place, and time. She appears well-developed and well-nourished. No distress.  HENT:  Head: Normocephalic and atraumatic.  Right Ear: External ear normal.  Left Ear: External ear normal.  Eyes: Conjunctivae are normal. Right eye exhibits no discharge. Left eye exhibits no discharge. No scleral icterus.  Neck: No JVD present. No tracheal deviation present. No thyromegaly present.  Cardiovascular: Normal rate, regular rhythm and normal heart sounds.  Pulmonary/Chest: Effort normal and breath sounds normal. No stridor.  Abdominal: Bowel sounds are normal.  Musculoskeletal:        General: No edema.     Cervical back: Neck supple.  Lymphadenopathy:    She has no cervical adenopathy.  Neurological: She is alert and oriented to person, place, and time.  Skin: Skin is warm and dry. She is not diaphoretic.  Psychiatric: She has a normal mood and affect. Her behavior is normal.    BP 126/84   Pulse 69   Temp (!) 96.5 F (35.8 C) (Tympanic)   Ht 5\' 6"  (1.676 m)   Wt 218 lb 3.2 oz (99 kg)   SpO2 96%   BMI 35.22 kg/m  Wt Readings from Last 3 Encounters:  05/08/19 218 lb 3.2 oz (99 kg)  04/07/19 221 lb (100.2 kg)  10/17/18 224 lb 8 oz (101.8 kg)     Health Maintenance Due  Topic Date Due  . TETANUS/TDAP  05/11/1969  . COLONOSCOPY  04/20/2015  . PNA vac Low Risk Adult (1 of 2 - PCV13) 05/12/2015  . INFLUENZA VACCINE  11/08/2018    There are no preventive care reminders to display for this patient.  Lab Results  Component Value Date   TSH 2.40 03/22/2017   Lab Results  Component Value Date   WBC 4.7 05/08/2019   HGB 13.5 05/08/2019   HCT 40.6 05/08/2019   MCV 85.7 05/08/2019   PLT 364.0 05/08/2019   Lab Results  Component Value Date   NA 138 05/08/2019   K 3.8 05/08/2019   CO2 27 05/08/2019   GLUCOSE 90 05/08/2019   BUN 27 (H) 05/08/2019   CREATININE 1.18 05/08/2019   BILITOT 0.6 05/08/2019   ALKPHOS 63  05/08/2019   AST 18 05/08/2019   ALT 15 05/08/2019   PROT 7.2 05/08/2019   ALBUMIN 4.2 05/08/2019   CALCIUM 9.9 05/08/2019   GFR 54.95 (L) 05/08/2019   Lab Results  Component Value Date   CHOL 155 05/08/2019   Lab Results  Component Value Date   HDL 58.40 05/08/2019   Lab Results  Component Value Date   LDLCALC 84 05/08/2019   Lab Results  Component Value Date   TRIG 63.0 05/08/2019   Lab Results  Component Value Date   CHOLHDL  3 05/08/2019   Lab Results  Component Value Date   HGBA1C 6.6 (H) 05/08/2019      Assessment & Plan:   Problem List Items Addressed This Visit      Cardiovascular and Mediastinum   Essential hypertension - Primary   Relevant Medications   hydrochlorothiazide (HYDRODIURIL) 25 MG tablet   lisinopril (ZESTRIL) 10 MG tablet     Endocrine   Type 2 diabetes mellitus without complication, without long-term current use of insulin (HCC)   Relevant Medications   lisinopril (ZESTRIL) 10 MG tablet   metFORMIN (GLUCOPHAGE-XR) 500 MG 24 hr tablet   Other Relevant Orders   Amb ref to Medical Nutrition Therapy-MNT     Other   Vitamin D deficiency    Other Visit Diagnoses    Elevated cholesterol       Relevant Medications   hydrochlorothiazide (HYDRODIURIL) 25 MG tablet   lisinopril (ZESTRIL) 10 MG tablet      Meds ordered this encounter  Medications  . hydrochlorothiazide (HYDRODIURIL) 25 MG tablet    Sig: Take 1 tablet (25 mg total) by mouth daily.    Dispense:  90 tablet    Refill:  0  . lisinopril (ZESTRIL) 10 MG tablet    Sig: Take 1 tablet (10 mg total) by mouth daily.    Dispense:  90 tablet    Refill:  0  . metFORMIN (GLUCOPHAGE-XR) 500 MG 24 hr tablet    Sig: TAKE 1 TABLET(500 MG) BY MOUTH AT BEDTIME    Dispense:  90 tablet    Refill:  0    Please inform pt that an in person office visit is needed with labs for refills    Follow-up: Return in about 1 month (around 06/07/2019), or check and record bps..   Would love to  see her pressure in the 120/80 range but told her I would accept anything less than 140 over less than 90. Libby Maw, MD

## 2019-05-15 ENCOUNTER — Other Ambulatory Visit: Payer: Self-pay | Admitting: Family Medicine

## 2019-05-15 DIAGNOSIS — E119 Type 2 diabetes mellitus without complications: Secondary | ICD-10-CM

## 2019-05-15 DIAGNOSIS — I10 Essential (primary) hypertension: Secondary | ICD-10-CM

## 2019-05-15 NOTE — Addendum Note (Signed)
Addended by: Jon Billings on: 05/15/2019 12:43 PM   Modules accepted: Orders

## 2019-05-19 ENCOUNTER — Telehealth: Payer: Self-pay | Admitting: Family Medicine

## 2019-05-19 NOTE — Telephone Encounter (Signed)
Spoke with patient who wanted to clarify medications that was given at the pharmacy yesterday.t verbally understood she is to stop Vitamin d and start on Hydrochlorothiazide. No other concerns at this time.

## 2019-05-19 NOTE — Telephone Encounter (Signed)
Patient is calling and is requesting a call back regarding medication. CB is [phone].

## 2019-07-28 ENCOUNTER — Encounter: Payer: Self-pay | Admitting: Nurse Practitioner

## 2019-07-28 ENCOUNTER — Telehealth (INDEPENDENT_AMBULATORY_CARE_PROVIDER_SITE_OTHER): Payer: BC Managed Care – PPO | Admitting: Nurse Practitioner

## 2019-07-28 DIAGNOSIS — R1013 Epigastric pain: Secondary | ICD-10-CM

## 2019-07-28 MED ORDER — OMEPRAZOLE 40 MG PO CPDR: 40.0000 mg | DELAYED_RELEASE_CAPSULE | Freq: Every day | ORAL | 0 refills | Status: DC

## 2019-07-28 NOTE — Patient Instructions (Signed)
Maintain low fat diet and adequate oral hydration Schedule f/up appt with PCP within the next 7days to discuss additional eval of gallbladder and/or referral to surgeon. GO to ED if symptoms worsen  Gallbladder Eating Plan If you have a gallbladder condition, you may have trouble digesting fats. Eating a low-fat diet can help reduce your symptoms, and may be helpful before and after having surgery to remove your gallbladder (cholecystectomy). Your health care provider may recommend that you work with a diet and nutrition specialist (dietitian) to help you reduce the amount of fat in your diet. What are tips for following this plan? General guidelines  Limit your fat intake to less than 30% of your total daily calories. If you eat around 1,800 calories each day, this is less than 60 grams (g) of fat per day.  Fat is an important part of a healthy diet. Eating a low-fat diet can make it hard to maintain a healthy body weight. Ask your dietitian how much fat, calories, and other nutrients you need each day.  Eat small, frequent meals throughout the day instead of three large meals.  Drink at least 8-10 cups of fluid a day. Drink enough fluid to keep your urine clear or pale yellow.  Limit alcohol intake to no more than 1 drink a day for nonpregnant women and 2 drinks a day for men. One drink equals 12 oz of beer, 5 oz of wine, or 1 oz of hard liquor. Reading food labels  Check Nutrition Facts on food labels for the amount of fat per serving. Choose foods with less than 3 grams of fat per serving. Shopping  Choose nonfat and low-fat healthy foods. Look for the words "nonfat," "low fat," or "fat free."  Avoid buying processed or prepackaged foods. Cooking  Cook using low-fat methods, such as baking, broiling, grilling, or boiling.  Cook with small amounts of healthy fats, such as olive oil, grapeseed oil, canola oil, or sunflower oil. What foods are recommended?   All fresh, frozen, or  canned fruits and vegetables.  Whole grains.  Low-fat or non-fat (skim) milk and yogurt.  Lean meat, skinless poultry, fish, eggs, and beans.  Low-fat protein supplement powders or drinks.  Spices and herbs. What foods are not recommended?  High-fat foods. These include baked goods, fast food, fatty cuts of meat, ice cream, french toast, sweet rolls, pizza, cheese bread, foods covered with butter, creamy sauces, or cheese.  Fried foods. These include french fries, tempura, battered fish, breaded chicken, fried breads, and sweets.  Foods with strong odors.  Foods that cause bloating and gas. Summary  A low-fat diet can be helpful if you have a gallbladder condition, or before and after gallbladder surgery.  Limit your fat intake to less than 30% of your total daily calories. This is about 60 g of fat if you eat 1,800 calories each day.  Eat small, frequent meals throughout the day instead of three large meals. This information is not intended to replace advice given to you by your health care provider. Make sure you discuss any questions you have with your health care provider. Document Revised: 07/17/2018 Document Reviewed: 05/03/2016 Elsevier Patient Education  [place].

## 2019-07-28 NOTE — Progress Notes (Signed)
Virtual Visit via Video Note  I connected with@ on 07/28/19 at 12:30 PM EDT by a video enabled telemedicine application and verified that I am speaking with the correct person using two identifiers.  Location: Patient:Home Provider: Office Participants: patient and provider   I discussed the limitations of evaluation and management by telemedicine and the availability of in person appointments. I also discussed with the patient that there may be a patient responsible charge related to this service. The patient expressed understanding and agreed to proceed.  CC:on and off for years, worse on Sunday: bloating, pain, vomiiting, and chils. all symptoms now resolved.  History of Present Illness: Unable to provide any vital signs. report hx of gallbladder insufficiency. Reviewed ABD [place] and CT ABD completed 2017 by [facility]. Onset of current symptoms after fatty food intake (cheese, bacon and eggs on Sunday Morning). Last BM on Saturday (normal per patient).  GI Problem The primary symptoms include abdominal pain, nausea and vomiting. Primary symptoms do not include fever, weight loss, fatigue, diarrhea, melena, hematemesis, jaundice, hematochezia, dysuria, myalgias, arthralgias or rash. The illness began 2 days ago. The onset was sudden. The problem has been resolved.  The illness is also significant for chills and bloating. The illness does not include anorexia, dysphagia, odynophagia, constipation, tenesmus, back pain or itching. Significant associated medical issues include GERD. Associated medical issues do not include gallstones, irritable bowel syndrome or diverticulitis.   Observations/Objective: Physical Exam  Constitutional: She is oriented to person, place, and time. No distress.  Eyes: Conjunctivae are normal.  Pulmonary/Chest: Effort normal.  Neurological: She is alert and oriented to person, place, and time.  Psychiatric: She has a normal mood and affect. Her behavior is normal.  Thought content normal.   Assessment and Plan: Ann Mcguire was seen today for gi problem.  Diagnoses and all orders for this visit:  Epigastric pain -     omeprazole (PRILOSEC) 40 MG capsule; Take 1 capsule (40 mg total) by mouth daily.    Follow Up Instructions: Maintain low fat diet and adequate oral hydration Schedule f/up appt with PCP within the next 7days to discuss additional eval of gallbladder and/or referral to surgeon. GO to ED if symptoms worsen   I discussed the assessment and treatment plan with the patient. The patient was provided an opportunity to ask questions and all were answered. The patient agreed with the plan and demonstrated an understanding of the instructions.   The patient was advised to call back or seek an in-person evaluation if the symptoms worsen or if the condition fails to improve as anticipated.  Wilfred Lacy, NP

## 2019-09-08 ENCOUNTER — Other Ambulatory Visit: Payer: Self-pay | Admitting: Family Medicine

## 2019-09-08 DIAGNOSIS — I10 Essential (primary) hypertension: Secondary | ICD-10-CM

## 2019-09-09 ENCOUNTER — Other Ambulatory Visit: Payer: Self-pay | Admitting: Family Medicine

## 2019-09-09 DIAGNOSIS — I10 Essential (primary) hypertension: Secondary | ICD-10-CM

## 2019-09-14 ENCOUNTER — Telehealth: Payer: Self-pay | Admitting: Nurse Practitioner

## 2019-09-14 DIAGNOSIS — R1013 Epigastric pain: Secondary | ICD-10-CM

## 2019-09-15 ENCOUNTER — Other Ambulatory Visit: Payer: Self-pay | Admitting: Family Medicine

## 2019-09-15 DIAGNOSIS — R1013 Epigastric pain: Secondary | ICD-10-CM

## 2019-09-15 MED ORDER — OMEPRAZOLE 40 MG PO CPDR: 40.0000 mg | DELAYED_RELEASE_CAPSULE | Freq: Every day | ORAL | 0 refills | Status: DC

## 2019-09-15 NOTE — Telephone Encounter (Signed)
Patient is calling back to check the status of refill request. CB is [phone]

## 2019-09-15 NOTE — Telephone Encounter (Signed)
Medication sent in Per, Dr.Kremer, today.

## 2019-09-16 MED ORDER — OMEPRAZOLE 40 MG PO CPDR: 40.0000 mg | DELAYED_RELEASE_CAPSULE | Freq: Every day | ORAL | 0 refills | Status: DC

## 2019-09-30 ENCOUNTER — Telehealth: Payer: Self-pay | Admitting: Family Medicine

## 2019-09-30 NOTE — Telephone Encounter (Signed)
Spoke with patient who state that she would like to keep appointment that she has 10/02/19 I informed patient that the appointment was still in so she will be here for that appointment.

## 2019-09-30 NOTE — Telephone Encounter (Signed)
Patient wants to reschedule her appt that she has this Friday. She wants to know if she needs to be fasting for her appt so she'll know whether to come in the morning or afternoon when she reschedules. Please advise.  Patients contact info: [phone]

## 2019-10-02 ENCOUNTER — Encounter: Payer: Self-pay | Admitting: Family Medicine

## 2019-10-02 ENCOUNTER — Other Ambulatory Visit: Payer: Self-pay

## 2019-10-02 ENCOUNTER — Ambulatory Visit: Payer: BC Managed Care – PPO | Admitting: Family Medicine

## 2019-10-02 VITALS — BP 148/82 | HR 61 | Temp 97.5°F | Ht 66.0 in | Wt 215.2 lb

## 2019-10-02 DIAGNOSIS — E7800 Pure hypercholesterolemia, unspecified: Secondary | ICD-10-CM | POA: Diagnosis not present

## 2019-10-02 DIAGNOSIS — R1013 Epigastric pain: Secondary | ICD-10-CM

## 2019-10-02 DIAGNOSIS — E119 Type 2 diabetes mellitus without complications: Secondary | ICD-10-CM | POA: Diagnosis not present

## 2019-10-02 DIAGNOSIS — I10 Essential (primary) hypertension: Secondary | ICD-10-CM

## 2019-10-02 DIAGNOSIS — Z0000 Encounter for general adult medical examination without abnormal findings: Secondary | ICD-10-CM

## 2019-10-02 LAB — COMPREHENSIVE METABOLIC PANEL
ALT: 18 U/L (ref 0–35)
AST: 18 U/L (ref 0–37)
Albumin: 4 g/dL (ref 3.5–5.2)
Alkaline Phosphatase: 63 U/L (ref 39–117)
BUN: 12 mg/dL (ref 6–23)
CO2: 26 mEq/L (ref 19–32)
Calcium: 9.4 mg/dL (ref 8.4–10.5)
Chloride: 107 mEq/L (ref 96–112)
Creatinine, Ser: 1.11 mg/dL (ref 0.40–1.20)
GFR: 58.9 mL/min — ABNORMAL LOW (ref >60.00)
Glucose, Bld: 92 mg/dL (ref 70–99)
Potassium: 4.1 mEq/L (ref 3.5–5.1)
Sodium: 142 mEq/L (ref 135–145)
Total Bilirubin: 0.6 mg/dL (ref 0.2–1.2)
Total Protein: 6.5 g/dL (ref 6.0–8.3)

## 2019-10-02 LAB — URINALYSIS, ROUTINE W REFLEX MICROSCOPIC
Bilirubin Urine: NEGATIVE
Ketones, ur: NEGATIVE
Nitrite: NEGATIVE
Specific Gravity, Urine: 1.02 (ref 1.000–1.030)
Total Protein, Urine: NEGATIVE
Urine Glucose: NEGATIVE
Urobilinogen, UA: 0.2 (ref 0.0–1.0)
pH: 6.5 (ref 5.0–8.0)

## 2019-10-02 LAB — LDL CHOLESTEROL, DIRECT: Direct LDL: 82 mg/dL

## 2019-10-02 LAB — MICROALBUMIN / CREATININE URINE RATIO
Creatinine,U: 97.3 mg/dL
Microalb Creat Ratio: 0.7 mg/g (ref 0.0–30.0)
Microalb, Ur: <0.7 mg/dL (ref 0.0–1.9)

## 2019-10-02 LAB — LIPID PANEL
Cholesterol: 151 mg/dL (ref 0–200)
HDL: 52 mg/dL (ref >39.00)
LDL Cholesterol: 88 mg/dL (ref 0–99)
NonHDL: 98.79
Total CHOL/HDL Ratio: 3
Triglycerides: 54 mg/dL (ref 0.0–149.0)
VLDL: 10.8 mg/dL (ref 0.0–40.0)

## 2019-10-02 LAB — AMYLASE: Amylase: 46 U/L (ref 27–131)

## 2019-10-02 LAB — HEMOGLOBIN A1C: Hgb A1c MFr Bld: 6.6 % — ABNORMAL HIGH (ref 4.6–6.5)

## 2019-10-02 LAB — LIPASE: Lipase: 14 U/L (ref 11.0–59.0)

## 2019-10-02 MED ORDER — OMEPRAZOLE 40 MG PO CPDR: 40.0000 mg | DELAYED_RELEASE_CAPSULE | Freq: Every day | ORAL | 0 refills | Status: DC

## 2019-10-02 MED ORDER — LISINOPRIL 20 MG PO TABS: 20.0000 mg | ORAL_TABLET | Freq: Every day | ORAL | 0 refills | Status: DC

## 2019-10-02 MED ORDER — HYDROCHLOROTHIAZIDE 25 MG PO TABS: ORAL_TABLET | ORAL | 0 refills | Status: DC

## 2019-10-02 NOTE — Patient Instructions (Signed)
Gastroesophageal Reflux Disease, Adult Gastroesophageal reflux (GER) happens when acid from the stomach flows up into the tube that connects the mouth and the stomach (esophagus). Normally, food travels down the esophagus and stays in the stomach to be digested. However, when a person has GER, food and stomach acid sometimes move back up into the esophagus. If this becomes a more serious problem, the person may be diagnosed with a disease called gastroesophageal reflux disease (GERD). GERD occurs when the reflux:  Happens often.  Causes frequent or severe symptoms.  Causes problems such as damage to the esophagus. When stomach acid comes in contact with the esophagus, the acid may cause soreness (inflammation) in the esophagus. Over time, GERD may create small holes (ulcers) in the lining of the esophagus. What are the causes? This condition is caused by a problem with the muscle between the esophagus and the stomach (lower esophageal sphincter, or LES). Normally, the LES muscle closes after food passes through the esophagus to the stomach. When the LES is weakened or abnormal, it does not close properly, and that allows food and stomach acid to go back up into the esophagus. The LES can be weakened by certain dietary substances, medicines, and medical conditions, including:  Tobacco use.  Pregnancy.  Having a hiatal hernia.  Alcohol use.  Certain foods and beverages, such as coffee, chocolate, onions, and peppermint. What increases the risk? You are more likely to develop this condition if you:  Have an increased body weight.  Have a connective tissue disorder.  Use NSAID medicines. What are the signs or symptoms? Symptoms of this condition include:  Heartburn.  Difficult or painful swallowing.  The feeling of having a lump in the throat.  Abitter taste in the mouth.  Bad breath.  Having a large amount of saliva.  Having an upset or bloated  stomach.  Belching.  Chest pain. Different conditions can cause chest pain. Make sure you see your health care provider if you experience chest pain.  Shortness of breath or wheezing.  Ongoing (chronic) cough or a night-time cough.  Wearing away of tooth enamel.  Weight loss. How is this diagnosed? Your health care provider will take a medical history and perform a physical exam. To determine if you have mild or severe GERD, your health care provider may also monitor how you respond to treatment. You may also have tests, including:  A test to examine your stomach and esophagus with a small camera (endoscopy).  A test thatmeasures the acidity level in your esophagus.  A test thatmeasures how much pressure is on your esophagus.  A barium swallow or modified barium swallow test to show the shape, size, and functioning of your esophagus. How is this treated? The goal of treatment is to help relieve your symptoms and to prevent complications. Treatment for this condition may vary depending on how severe your symptoms are. Your health care provider may recommend:  Changes to your diet.  Medicine.  Surgery. Follow these instructions at home: Eating and drinking   Follow a diet as recommended by your health care provider. This may involve avoiding foods and drinks such as: ? Coffee and tea (with or without caffeine). ? Drinks that containalcohol. ? Energy drinks and sports drinks. ? Carbonated drinks or sodas. ? Chocolate and cocoa. ? Peppermint and mint flavorings. ? Garlic and onions. ? Horseradish. ? Spicy and acidic foods, including peppers, chili powder, curry powder, vinegar, hot sauces, and barbecue sauce. ? Citrus fruit juices and citrus   fruits, such as oranges, lemons, and limes. ? Tomato-based foods, such as red sauce, chili, salsa, and pizza with red sauce. ? Fried and fatty foods, such as donuts, french fries, potato chips, and high-fat dressings. ? High-fat  meats, such as hot dogs and fatty cuts of red and white meats, such as rib eye steak, sausage, ham, and bacon. ? High-fat dairy items, such as whole milk, butter, and cream cheese.  Eat small, frequent meals instead of large meals.  Avoid drinking large amounts of liquid with your meals.  Avoid eating meals during the 2-3 hours before bedtime.  Avoid lying down right after you eat.  Do not exercise right after you eat. Lifestyle   Do not use any products that contain nicotine or tobacco, such as cigarettes, e-cigarettes, and chewing tobacco. If you need help quitting, ask your health care provider.  Try to reduce your stress by using methods such as yoga or meditation. If you need help reducing stress, ask your health care provider.  If you are overweight, reduce your weight to an amount that is healthy for you. Ask your health care provider for guidance about a safe weight loss goal. General instructions  Pay attention to any changes in your symptoms.  Take over-the-counter and prescription medicines only as told by your health care provider. Do not take aspirin, ibuprofen, or other NSAIDs unless your health care provider told you to do so.  Wear loose-fitting clothing. Do not wear anything tight around your waist that causes pressure on your abdomen.  Raise (elevate) the head of your bed about 6 inches (15 cm).  Avoid bending over if this makes your symptoms worse.  Keep all follow-up visits as told by your health care provider. This is important. Contact a health care provider if:  You have: ? New symptoms. ? Unexplained weight loss. ? Difficulty swallowing or it hurts to swallow. ? Wheezing or a persistent cough. ? A hoarse voice.  Your symptoms do not improve with treatment. Get help right away if you:  Have pain in your arms, neck, jaw, teeth, or back.  Feel sweaty, dizzy, or light-headed.  Have chest pain or shortness of breath.  Vomit and your vomit looks  like blood or coffee grounds.  Faint.  Have stool that is bloody or black.  Cannot swallow, drink, or eat. Summary  Gastroesophageal reflux happens when acid from the stomach flows up into the esophagus. GERD is a disease in which the reflux happens often, causes frequent or severe symptoms, or causes problems such as damage to the esophagus.  Treatment for this condition may vary depending on how severe your symptoms are. Your health care provider may recommend diet and lifestyle changes, medicine, or surgery.  Contact a health care provider if you have new or worsening symptoms.  Take over-the-counter and prescription medicines only as told by your health care provider. Do not take aspirin, ibuprofen, or other NSAIDs unless your health care provider told you to do so.  Keep all follow-up visits as told by your health care provider. This is important. This information is not intended to replace advice given to you by your health care provider. Make sure you discuss any questions you have with your health care provider. Document Revised: 10/02/2017 Document Reviewed: 10/02/2017 Elsevier Patient Education  2020 [employer]  

## 2019-10-02 NOTE — Progress Notes (Signed)
Established Patient Office Visit  Subjective:  Patient ID: Ann Mcguire, female    DOB: 27-Nov-1950  Age: 69 y.o. MRN: [id number]  CC:  Chief Complaint  Patient presents with  . Follow-up    refill/follow up on medications concerns about gallbladder issues would like referral to specialist     HPI Ann Mcguire presents for follow-up of her hypertension, diabetes and midepigastric pain.  Recent episode of significant midepigastric pain directly after eating eggs with cheese.  History of a sluggish gallbladder by a CT scanning performed in 2017.  She was seen for this last week and was placed on omeprazole.  symptoms have greatly resolved.  Past Medical History:  Diagnosis Date  . Chicken pox   . Fainting spell   . Hypertension     No past surgical history on file.  Family History  Problem Relation Age of Onset  . Hypertension Sister     Social History   Socioeconomic History  . Marital status: Divorced    Spouse name: Not on file  . Number of children: Not on file  . Years of education: Not on file  . Highest education level: Not on file  Occupational History  . Not on file  Tobacco Use  . Smoking status: Never Smoker  . Smokeless tobacco: Never Used  Substance and Sexual Activity  . Alcohol use: No  . Drug use: No  . Sexual activity: Not on file  Other Topics Concern  . Not on file  Social History Narrative  . Not on file   Social Determinants of Health   Financial Resource Strain:   . Difficulty of Paying Living Expenses:   Food Insecurity:   . Worried About Charity fundraiser in the Last Year:   . Arboriculturist in the Last Year:   Transportation Needs:   . Film/video editor (Medical):   Marland Kitchen Lack of Transportation (Non-Medical):   Physical Activity:   . Days of Exercise per Week:   . Minutes of Exercise per Session:   Stress:   . Feeling of Stress :   Social Connections:   . Frequency of Communication with Friends and Family:   .  Frequency of Social Gatherings with Friends and Family:   . Attends Religious Services:   . Active Member of Clubs or Organizations:   . Attends Archivist Meetings:   Marland Kitchen Marital Status:   Intimate Partner Violence:   . Fear of Current or Ex-Partner:   . Emotionally Abused:   Marland Kitchen Physically Abused:   . Sexually Abused:     Outpatient Medications Prior to Visit  Medication Sig Dispense Refill  . metFORMIN (GLUCOPHAGE-XR) 500 MG 24 hr tablet TAKE 1 TABLET(500 MG) BY MOUTH AT BEDTIME 90 tablet 0  . hydrochlorothiazide (HYDRODIURIL) 25 MG tablet TAKE 1 TABLET(25 MG) BY MOUTH DAILY 30 tablet 0  . lisinopril (ZESTRIL) 10 MG tablet TAKE 1 TABLET(10 MG) BY MOUTH DAILY 30 tablet 0  . omeprazole (PRILOSEC) 40 MG capsule Take 1 capsule (40 mg total) by mouth daily. (Patient not taking: Reported on 10/02/2019) 14 capsule 0   No facility-administered medications prior to visit.    No Known Allergies  ROS Review of Systems    Objective:    Physical Exam  BP (!) 148/82   Pulse 61   Temp (!) 97.5 F (36.4 C) (Tympanic)   Ht 5\' 6"  (1.676 m)   Wt 215 lb 3.2 oz (97.6 kg)  SpO2 96%   BMI 34.73 kg/m  Wt Readings from Last 3 Encounters:  10/02/19 215 lb 3.2 oz (97.6 kg)  05/08/19 218 lb 3.2 oz (99 kg)  04/07/19 221 lb (100.2 kg)     Health Maintenance Due  Topic Date Due  . COVID-19 Vaccine (1) Never done  . TETANUS/TDAP  Never done  . COLONOSCOPY  04/20/2015  . PNA vac Low Risk Adult (1 of 2 - PCV13) Never done    There are no preventive care reminders to display for this patient.  Lab Results  Component Value Date   TSH 2.40 03/22/2017   Lab Results  Component Value Date   WBC 4.7 05/08/2019   HGB 13.5 05/08/2019   HCT 40.6 05/08/2019   MCV 85.7 05/08/2019   PLT 364.0 05/08/2019   Lab Results  Component Value Date   NA 138 05/08/2019   K 3.8 05/08/2019   CO2 27 05/08/2019   GLUCOSE 90 05/08/2019   BUN 27 (H) 05/08/2019   CREATININE 1.18 05/08/2019    BILITOT 0.6 05/08/2019   ALKPHOS 63 05/08/2019   AST 18 05/08/2019   ALT 15 05/08/2019   PROT 7.2 05/08/2019   ALBUMIN 4.2 05/08/2019   CALCIUM 9.9 05/08/2019   GFR 54.95 (L) 05/08/2019   Lab Results  Component Value Date   CHOL 155 05/08/2019   Lab Results  Component Value Date   HDL 58.40 05/08/2019   Lab Results  Component Value Date   LDLCALC 84 05/08/2019   Lab Results  Component Value Date   TRIG 63.0 05/08/2019   Lab Results  Component Value Date   CHOLHDL 3 05/08/2019   Lab Results  Component Value Date   HGBA1C 6.6 (H) 05/08/2019      Assessment & Plan:   Problem List Items Addressed This Visit      Cardiovascular and Mediastinum   Essential hypertension   Relevant Medications   hydrochlorothiazide (HYDRODIURIL) 25 MG tablet   lisinopril (ZESTRIL) 20 MG tablet   Other Relevant Orders   Urinalysis, Routine w reflex microscopic   Microalbumin / creatinine urine ratio     Endocrine   Type 2 diabetes mellitus without complication, without long-term current use of insulin (HCC)   Relevant Medications   lisinopril (ZESTRIL) 20 MG tablet   Other Relevant Orders   Comprehensive metabolic panel   Hemoglobin A1c   Urinalysis, Routine w reflex microscopic   Microalbumin / creatinine urine ratio    Other Visit Diagnoses    Epigastric pain    -  Primary   Relevant Medications   omeprazole (PRILOSEC) 40 MG capsule   Other Relevant Orders   Amylase   Comprehensive metabolic panel   Lipase   Ambulatory referral to Gastroenterology   Elevated cholesterol       Relevant Medications   hydrochlorothiazide (HYDRODIURIL) 25 MG tablet   lisinopril (ZESTRIL) 20 MG tablet   Other Relevant Orders   LDL cholesterol, direct   Lipid panel   Healthcare maintenance       Relevant Orders   Ambulatory referral to Gastroenterology      Meds ordered this encounter  Medications  . hydrochlorothiazide (HYDRODIURIL) 25 MG tablet    Sig: TAKE 1 TABLET(25 MG) BY  MOUTH DAILY    Dispense:  90 tablet    Refill:  0  . lisinopril (ZESTRIL) 20 MG tablet    Sig: Take 1 tablet (20 mg total) by mouth daily.    Dispense:  90 tablet  Refill:  0  . omeprazole (PRILOSEC) 40 MG capsule    Sig: Take 1 capsule (40 mg total) by mouth daily.    Dispense:  90 capsule    Refill:  0    Follow-up: Return in about 6 months (around 04/02/2020), or if symptoms worsen or fail to improve.   We will continue omeprazole.  Have increased lisinopril to 20 mg daily. Libby Maw, MD

## 2019-10-06 ENCOUNTER — Telehealth: Payer: Self-pay | Admitting: Family Medicine

## 2019-10-06 NOTE — Telephone Encounter (Signed)
Patient is calling and wanted to speak to someone regarding lisinopril and the dosage change and also patient wanted to see if she can get prilosec pills instead of the capsules. CB is [phone].

## 2019-10-07 NOTE — Telephone Encounter (Signed)
Returned patients call no answer LMTCB 

## 2019-10-13 ENCOUNTER — Encounter: Payer: Self-pay | Admitting: Gastroenterology

## 2019-10-13 ENCOUNTER — Other Ambulatory Visit: Payer: Self-pay

## 2019-10-13 ENCOUNTER — Emergency Department (HOSPITAL_BASED_OUTPATIENT_CLINIC_OR_DEPARTMENT_OTHER): Payer: BC Managed Care – PPO

## 2019-10-13 ENCOUNTER — Encounter (HOSPITAL_BASED_OUTPATIENT_CLINIC_OR_DEPARTMENT_OTHER): Payer: Self-pay

## 2019-10-13 ENCOUNTER — Emergency Department (HOSPITAL_COMMUNITY)
Admission: EM | Admit: 2019-10-13 | Discharge: 2019-10-14 | Discharge status: Against Medical Advice | Payer: BC Managed Care – PPO | Source: Home / Self Care | Attending: Emergency Medicine | Admitting: Emergency Medicine

## 2019-10-13 DIAGNOSIS — R109 Unspecified abdominal pain: Secondary | ICD-10-CM | POA: Diagnosis not present

## 2019-10-13 DIAGNOSIS — N83202 Unspecified ovarian cyst, left side: Secondary | ICD-10-CM | POA: Insufficient documentation

## 2019-10-13 DIAGNOSIS — R1011 Right upper quadrant pain: Secondary | ICD-10-CM

## 2019-10-13 DIAGNOSIS — Z79899 Other long term (current) drug therapy: Secondary | ICD-10-CM | POA: Diagnosis not present

## 2019-10-13 DIAGNOSIS — R932 Abnormal findings on diagnostic imaging of liver and biliary tract: Secondary | ICD-10-CM | POA: Diagnosis not present

## 2019-10-13 DIAGNOSIS — N83209 Unspecified ovarian cyst, unspecified side: Secondary | ICD-10-CM

## 2019-10-13 DIAGNOSIS — Z20822 Contact with and (suspected) exposure to covid-19: Secondary | ICD-10-CM | POA: Diagnosis not present

## 2019-10-13 DIAGNOSIS — K8050 Calculus of bile duct without cholangitis or cholecystitis without obstruction: Secondary | ICD-10-CM | POA: Diagnosis not present

## 2019-10-13 DIAGNOSIS — K838 Other specified diseases of biliary tract: Secondary | ICD-10-CM | POA: Diagnosis not present

## 2019-10-13 DIAGNOSIS — N179 Acute kidney failure, unspecified: Secondary | ICD-10-CM | POA: Diagnosis not present

## 2019-10-13 DIAGNOSIS — R7989 Other specified abnormal findings of blood chemistry: Secondary | ICD-10-CM | POA: Diagnosis not present

## 2019-10-13 DIAGNOSIS — E11649 Type 2 diabetes mellitus with hypoglycemia without coma: Secondary | ICD-10-CM | POA: Diagnosis not present

## 2019-10-13 DIAGNOSIS — E669 Obesity, unspecified: Secondary | ICD-10-CM | POA: Diagnosis not present

## 2019-10-13 DIAGNOSIS — K831 Obstruction of bile duct: Secondary | ICD-10-CM

## 2019-10-13 DIAGNOSIS — K8062 Calculus of gallbladder and bile duct with acute cholecystitis without obstruction: Secondary | ICD-10-CM | POA: Diagnosis not present

## 2019-10-13 DIAGNOSIS — Z7984 Long term (current) use of oral hypoglycemic drugs: Secondary | ICD-10-CM | POA: Diagnosis not present

## 2019-10-13 DIAGNOSIS — E860 Dehydration: Secondary | ICD-10-CM | POA: Diagnosis not present

## 2019-10-13 DIAGNOSIS — K8020 Calculus of gallbladder without cholecystitis without obstruction: Secondary | ICD-10-CM

## 2019-10-13 DIAGNOSIS — E559 Vitamin D deficiency, unspecified: Secondary | ICD-10-CM | POA: Diagnosis not present

## 2019-10-13 DIAGNOSIS — I10 Essential (primary) hypertension: Secondary | ICD-10-CM | POA: Diagnosis not present

## 2019-10-13 DIAGNOSIS — Z6834 Body mass index (BMI) 34.0-34.9, adult: Secondary | ICD-10-CM | POA: Diagnosis not present

## 2019-10-13 DIAGNOSIS — K810 Acute cholecystitis: Secondary | ICD-10-CM | POA: Diagnosis not present

## 2019-10-13 DIAGNOSIS — K8000 Calculus of gallbladder with acute cholecystitis without obstruction: Secondary | ICD-10-CM | POA: Diagnosis not present

## 2019-10-13 DIAGNOSIS — K8071 Calculus of gallbladder and bile duct without cholecystitis with obstruction: Secondary | ICD-10-CM | POA: Diagnosis not present

## 2019-10-13 DIAGNOSIS — Z03818 Encounter for observation for suspected exposure to other biological agents ruled out: Secondary | ICD-10-CM | POA: Diagnosis not present

## 2019-10-13 DIAGNOSIS — K8689 Other specified diseases of pancreas: Secondary | ICD-10-CM | POA: Diagnosis not present

## 2019-10-13 DIAGNOSIS — E119 Type 2 diabetes mellitus without complications: Secondary | ICD-10-CM | POA: Diagnosis not present

## 2019-10-13 DIAGNOSIS — R935 Abnormal findings on diagnostic imaging of other abdominal regions, including retroperitoneum: Secondary | ICD-10-CM | POA: Diagnosis not present

## 2019-10-13 LAB — CBC
HCT: 44 % (ref 36.0–46.0)
Hemoglobin: 14 g/dL (ref 12.0–15.0)
MCH: 27.7 pg (ref 26.0–34.0)
MCHC: 31.8 g/dL (ref 30.0–36.0)
MCV: 87.1 fL (ref 80.0–100.0)
Platelets: 329 10*3/uL (ref 150–400)
RBC: 5.05 MIL/uL (ref 3.87–5.11)
RDW: 14.5 % (ref 11.5–15.5)
WBC: 5.6 10*3/uL (ref 4.0–10.5)
nRBC: 0 % (ref 0.0–0.2)

## 2019-10-13 LAB — URINALYSIS, ROUTINE W REFLEX MICROSCOPIC
Bilirubin Urine: NEGATIVE
Glucose, UA: NEGATIVE mg/dL
Ketones, ur: NEGATIVE mg/dL
Nitrite: NEGATIVE
Protein, ur: NEGATIVE mg/dL
Specific Gravity, Urine: 1.02 (ref 1.005–1.030)
pH: 5.5 (ref 5.0–8.0)

## 2019-10-13 LAB — COMPREHENSIVE METABOLIC PANEL
ALT: 17 U/L (ref 0–44)
AST: 21 U/L (ref 15–41)
Albumin: 4.1 g/dL (ref 3.5–5.0)
Alkaline Phosphatase: 61 U/L (ref 38–126)
Anion gap: 11 (ref 5–15)
BUN: 15 mg/dL (ref 8–23)
CO2: 25 mmol/L (ref 22–32)
Calcium: 9.2 mg/dL (ref 8.9–10.3)
Chloride: 105 mmol/L (ref 98–111)
Creatinine, Ser: 1.2 mg/dL — ABNORMAL HIGH (ref 0.44–1.00)
GFR calc Af Amer: 53 mL/min — ABNORMAL LOW (ref >60)
GFR calc non Af Amer: 46 mL/min — ABNORMAL LOW (ref >60)
Glucose, Bld: 106 mg/dL — ABNORMAL HIGH (ref 70–99)
Potassium: 3.9 mmol/L (ref 3.5–5.1)
Sodium: 141 mmol/L (ref 135–145)
Total Bilirubin: 1 mg/dL (ref 0.3–1.2)
Total Protein: 7.8 g/dL (ref 6.5–8.1)

## 2019-10-13 LAB — URINALYSIS, MICROSCOPIC (REFLEX)

## 2019-10-13 LAB — LIPASE, BLOOD: Lipase: 30 U/L (ref 11–51)

## 2019-10-13 MED ORDER — ONDANSETRON HCL 4 MG/2ML IJ SOLN
4.0000 mg | Freq: Once | INTRAMUSCULAR | Status: AC
  Administered 2019-10-14: 4 mg via INTRAVENOUS
  Filled 2019-10-13: qty 2

## 2019-10-13 MED ORDER — SODIUM CHLORIDE 0.9% FLUSH
3.0000 mL | Freq: Once | INTRAVENOUS | Status: DC
  Filled 2019-10-13: qty 3

## 2019-10-13 MED ORDER — DICYCLOMINE HCL 10 MG/ML IM SOLN
20.0000 mg | Freq: Once | INTRAMUSCULAR | Status: AC
  Administered 2019-10-14: 20 mg via INTRAMUSCULAR
  Filled 2019-10-13: qty 2

## 2019-10-13 NOTE — ED Provider Notes (Signed)
TIME SEEN: 11:46 PM  CHIEF COMPLAINT: Abdominal pain, vomiting  HPI: Patient is a 69 year old female with history of hypertension who presents to the emergency department with persistent abdominal pain since Saturday.  Reports that she has had on and off symptoms for years where she will have epigastric or right upper quadrant abdominal pain that happens while eating.  She will feel bloated and have nausea and vomiting when symptoms will improve.  States she saw her primary care physician on the 25th and was referred to gastroenterology and has an appointment on 12/07/2019.  She was discharged on Prilosec but states that this has not been helping.  She denies any fevers or chills but states pain has now progressed and is more significant and constant.  No diarrhea, no bloody stools or melena, dysuria or hematuria, vaginal bleeding or discharge.  Denies chest pain or shortness of breath.  ROS: See HPI Constitutional: no fever  Eyes: no drainage  ENT: no runny nose   Cardiovascular:  no chest pain  Resp: no SOB  GI:  vomiting GU: no dysuria Integumentary: no rash  Allergy: no hives  Musculoskeletal: no leg swelling  Neurological: no slurred speech ROS otherwise negative  PAST MEDICAL HISTORY/PAST SURGICAL HISTORY:  Past Medical History:  Diagnosis Date  . Chicken pox   . Fainting spell   . Hypertension     MEDICATIONS:  Prior to Admission medications   Medication Sig Start Date End Date Taking? Authorizing Provider  hydrochlorothiazide (HYDRODIURIL) 25 MG tablet TAKE 1 TABLET(25 MG) BY MOUTH DAILY 10/02/19   Libby Maw, MD  lisinopril (ZESTRIL) 20 MG tablet Take 1 tablet (20 mg total) by mouth daily. 10/02/19   Libby Maw, MD  metFORMIN (GLUCOPHAGE-XR) 500 MG 24 hr tablet TAKE 1 TABLET(500 MG) BY MOUTH AT BEDTIME 05/08/19   Libby Maw, MD  omeprazole (PRILOSEC) 40 MG capsule Take 1 capsule (40 mg total) by mouth daily. 10/02/19   Libby Maw,  MD    ALLERGIES:  No Known Allergies  SOCIAL HISTORY:  Social History   Tobacco Use  . Smoking status: Never Smoker  . Smokeless tobacco: Never Used  Substance Use Topics  . Alcohol use: No    FAMILY HISTORY: Family History  Problem Relation Age of Onset  . Hypertension Sister     EXAM: BP (!) 162/74 (BP Location: Right Arm)   Pulse (!) 58   Temp 98.2 F (36.8 C) (Oral)   Resp 19   Ht 5\' 6"  (1.676 m)   Wt 95.7 kg   SpO2 100%   BMI 34.06 kg/m  CONSTITUTIONAL: Alert and oriented and responds appropriately to questions. Well-appearing; well-nourished HEAD: Normocephalic EYES: Conjunctivae clear, pupils appear equal, EOM appear intact ENT: normal nose; moist mucous membranes NECK: Supple, normal ROM CARD: RRR; S1 and S2 appreciated; no murmurs, no clicks, no rubs, no gallops RESP: Normal chest excursion without splinting or tachypnea; breath sounds clear and equal bilaterally; no wheezes, no rhonchi, no rales, no hypoxia or respiratory distress, speaking full sentences ABD/GI: Normal bowel sounds; non-distended; soft, tender in the epigastric region and right upper quadrant with positive Murphy sign, no rebound, no guarding, no peritoneal signs, no hepatosplenomegaly BACK:  The back appears normal EXT: Normal ROM in all joints; no deformity noted, no edema; no cyanosis SKIN: Normal color for age and race; warm; no rash on exposed skin NEURO: Moves all extremities equally PSYCH: The patient's mood and manner are appropriate.   MEDICAL DECISION MAKING:  Patient here with symptoms concerning for gallbladder pathology.  Unable to obtain ultrasound at this time.  We will proceed with CT imaging.  Labs, urine pending.  She drove herself to the emergency department and would like to drive home.  Will treat with Bentyl, Zofran.  ED PROGRESS: Labs show no leukocytosis, normal LFTs and lipase.  CT shows dilation of the gallbladder with mild central biliary ductal dilatation, common  bile duct dilatation.  She also has gallbladder sludge and gallstones.  Radiologist recommends ultrasound for further evaluation.  Patient reports minimal improvement in her pain with Bentyl.  Recommended transfer to [facility].  She would like to go by private vehicle.  CT scan also shows large left ovarian cyst measuring 7.7 cm.  She has no left lower abdominal pain.  She will need ultrasound not emergently as an outpatient.  Discussed with [facility] staff, Marvia Pickles, [place] for transfer to [facility] ED.  She will update Dr. Randal Buba in ED.  I reviewed all nursing notes and pertinent previous records as available.  I have reviewed and interpreted any EKGs, lab and urine results, imaging (as available).      Ann Mcguire was evaluated in Emergency Department on 10/13/2019 for the symptoms described in the history of present illness. She was evaluated in the context of the global COVID-19 pandemic, which necessitated consideration that the patient might be at risk for infection with the SARS-CoV-2 virus that causes COVID-19. Institutional protocols and algorithms that pertain to the evaluation of patients at risk for COVID-19 are in a state of rapid change based on information released by regulatory bodies including the CDC and federal and state organizations. These policies and algorithms were followed during the patient's care in the ED.      [place] Ann Mcguire, Ann Bison, DO 10/14/19 0215

## 2019-10-13 NOTE — ED Triage Notes (Signed)
Pt c/o right side abd pain x 3-4 months- worse x 3 days-states she has seen PCP and appt with GI 8/30-NAD-steady gait

## 2019-10-14 ENCOUNTER — Encounter (HOSPITAL_COMMUNITY): Payer: Self-pay

## 2019-10-14 ENCOUNTER — Inpatient Hospital Stay (HOSPITAL_COMMUNITY)
Admission: EM | Admit: 2019-10-14 | Discharge: 2019-10-17 | DRG: 418 | Disposition: A | Discharge status: Home / Self Care | Payer: BC Managed Care – PPO | Attending: Internal Medicine | Admitting: Internal Medicine

## 2019-10-14 ENCOUNTER — Emergency Department (HOSPITAL_COMMUNITY): Payer: BC Managed Care – PPO

## 2019-10-14 DIAGNOSIS — K810 Acute cholecystitis: Secondary | ICD-10-CM | POA: Diagnosis present

## 2019-10-14 DIAGNOSIS — K8020 Calculus of gallbladder without cholecystitis without obstruction: Secondary | ICD-10-CM | POA: Diagnosis present

## 2019-10-14 DIAGNOSIS — R933 Abnormal findings on diagnostic imaging of other parts of digestive tract: Secondary | ICD-10-CM

## 2019-10-14 DIAGNOSIS — E669 Obesity, unspecified: Secondary | ICD-10-CM

## 2019-10-14 DIAGNOSIS — Z7984 Long term (current) use of oral hypoglycemic drugs: Secondary | ICD-10-CM

## 2019-10-14 DIAGNOSIS — N179 Acute kidney failure, unspecified: Secondary | ICD-10-CM

## 2019-10-14 DIAGNOSIS — Z79899 Other long term (current) drug therapy: Secondary | ICD-10-CM

## 2019-10-14 DIAGNOSIS — Z6834 Body mass index (BMI) 34.0-34.9, adult: Secondary | ICD-10-CM

## 2019-10-14 DIAGNOSIS — K838 Other specified diseases of biliary tract: Secondary | ICD-10-CM | POA: Diagnosis not present

## 2019-10-14 DIAGNOSIS — Z20822 Contact with and (suspected) exposure to covid-19: Secondary | ICD-10-CM | POA: Diagnosis present

## 2019-10-14 DIAGNOSIS — R1011 Right upper quadrant pain: Secondary | ICD-10-CM

## 2019-10-14 DIAGNOSIS — R7989 Other specified abnormal findings of blood chemistry: Secondary | ICD-10-CM

## 2019-10-14 DIAGNOSIS — K8050 Calculus of bile duct without cholangitis or cholecystitis without obstruction: Secondary | ICD-10-CM | POA: Diagnosis not present

## 2019-10-14 DIAGNOSIS — N83209 Unspecified ovarian cyst, unspecified side: Secondary | ICD-10-CM

## 2019-10-14 DIAGNOSIS — E119 Type 2 diabetes mellitus without complications: Secondary | ICD-10-CM

## 2019-10-14 DIAGNOSIS — K8062 Calculus of gallbladder and bile duct with acute cholecystitis without obstruction: Principal | ICD-10-CM | POA: Diagnosis present

## 2019-10-14 DIAGNOSIS — I10 Essential (primary) hypertension: Secondary | ICD-10-CM

## 2019-10-14 DIAGNOSIS — E860 Dehydration: Secondary | ICD-10-CM | POA: Diagnosis present

## 2019-10-14 DIAGNOSIS — E11649 Type 2 diabetes mellitus with hypoglycemia without coma: Secondary | ICD-10-CM | POA: Diagnosis present

## 2019-10-14 DIAGNOSIS — R109 Unspecified abdominal pain: Secondary | ICD-10-CM | POA: Diagnosis present

## 2019-10-14 LAB — CBC WITH DIFFERENTIAL/PLATELET
Abs Immature Granulocytes: 0.01 10*3/uL (ref 0.00–0.07)
Basophils Absolute: 0 10*3/uL (ref 0.0–0.1)
Basophils Relative: 1 %
Eosinophils Absolute: 0 10*3/uL (ref 0.0–0.5)
Eosinophils Relative: 1 %
HCT: 41.4 % (ref 36.0–46.0)
Hemoglobin: 12.9 g/dL (ref 12.0–15.0)
Immature Granulocytes: 0 %
Lymphocytes Relative: 27 %
Lymphs Abs: 1.4 10*3/uL (ref 0.7–4.0)
MCH: 27.4 pg (ref 26.0–34.0)
MCHC: 31.2 g/dL (ref 30.0–36.0)
MCV: 88.1 fL (ref 80.0–100.0)
Monocytes Absolute: 0.5 10*3/uL (ref 0.1–1.0)
Monocytes Relative: 10 %
Neutro Abs: 3.1 10*3/uL (ref 1.7–7.7)
Neutrophils Relative %: 61 %
Platelets: 320 10*3/uL (ref 150–400)
RBC: 4.7 MIL/uL (ref 3.87–5.11)
RDW: 14.6 % (ref 11.5–15.5)
WBC: 5.1 10*3/uL (ref 4.0–10.5)
nRBC: 0 % (ref 0.0–0.2)

## 2019-10-14 LAB — COMPREHENSIVE METABOLIC PANEL
ALT: 55 U/L — ABNORMAL HIGH (ref 0–44)
AST: 86 U/L — ABNORMAL HIGH (ref 15–41)
Albumin: 3.8 g/dL (ref 3.5–5.0)
Alkaline Phosphatase: 70 U/L (ref 38–126)
Anion gap: 13 (ref 5–15)
BUN: 14 mg/dL (ref 8–23)
CO2: 24 mmol/L (ref 22–32)
Calcium: 9.4 mg/dL (ref 8.9–10.3)
Chloride: 106 mmol/L (ref 98–111)
Creatinine, Ser: 1.26 mg/dL — ABNORMAL HIGH (ref 0.44–1.00)
GFR calc Af Amer: 50 mL/min — ABNORMAL LOW (ref >60)
GFR calc non Af Amer: 43 mL/min — ABNORMAL LOW (ref >60)
Glucose, Bld: 96 mg/dL (ref 70–99)
Potassium: 4.1 mmol/L (ref 3.5–5.1)
Sodium: 143 mmol/L (ref 135–145)
Total Bilirubin: 0.8 mg/dL (ref 0.3–1.2)
Total Protein: 7.2 g/dL (ref 6.5–8.1)

## 2019-10-14 LAB — GLUCOSE, CAPILLARY
Glucose-Capillary: 117 mg/dL — ABNORMAL HIGH (ref 70–99)
Glucose-Capillary: 67 mg/dL — ABNORMAL LOW (ref 70–99)
Glucose-Capillary: 101 mg/dL — ABNORMAL HIGH (ref 70–99)

## 2019-10-14 LAB — SARS CORONAVIRUS 2 BY RT PCR (HOSPITAL ORDER, PERFORMED IN ~~LOC~~ HOSPITAL LAB): SARS Coronavirus 2: NEGATIVE

## 2019-10-14 LAB — TSH: TSH: 1.985 u[IU]/mL (ref 0.350–4.500)

## 2019-10-14 LAB — LIPASE, BLOOD: Lipase: 36 U/L (ref 11–51)

## 2019-10-14 MED ORDER — ONDANSETRON HCL 4 MG/2ML IJ SOLN
4.0000 mg | Freq: Once | INTRAMUSCULAR | Status: AC
  Administered 2019-10-14: 4 mg via INTRAVENOUS
  Filled 2019-10-14: qty 2

## 2019-10-14 MED ORDER — DEXTROSE 50 % IV SOLN
INTRAVENOUS | Status: AC
  Administered 2019-10-14: 25 mL
  Filled 2019-10-14: qty 50

## 2019-10-14 MED ORDER — SODIUM CHLORIDE 0.9 % IV SOLN: 1.0000 g | Freq: Once | INTRAVENOUS | Status: DC

## 2019-10-14 MED ORDER — ACETAMINOPHEN 325 MG PO TABS
650.0000 mg | ORAL_TABLET | Freq: Four times a day (QID) | ORAL | Status: DC | PRN
  Administered 2019-10-14 – 2019-10-16 (×2): 650 mg via ORAL
  Filled 2019-10-14 (×2): qty 2

## 2019-10-14 MED ORDER — ONDANSETRON HCL 4 MG PO TABS: 4.0000 mg | ORAL_TABLET | Freq: Four times a day (QID) | ORAL | Status: DC | PRN

## 2019-10-14 MED ORDER — DICYCLOMINE HCL 10 MG PO CAPS: 10.0000 mg | ORAL_CAPSULE | Freq: Three times a day (TID) | ORAL | 0 refills | Status: DC

## 2019-10-14 MED ORDER — DICYCLOMINE HCL 10 MG PO CAPS
10.0000 mg | ORAL_CAPSULE | Freq: Three times a day (TID) | ORAL | Status: DC
  Administered 2019-10-14 – 2019-10-17 (×9): 10 mg via ORAL
  Filled 2019-10-14 (×9): qty 1

## 2019-10-14 MED ORDER — TRAMADOL HCL 50 MG PO TABS
50.0000 mg | ORAL_TABLET | Freq: Four times a day (QID) | ORAL | Status: DC | PRN
  Administered 2019-10-14 – 2019-10-16 (×3): 50 mg via ORAL
  Filled 2019-10-14 (×5): qty 1

## 2019-10-14 MED ORDER — SODIUM CHLORIDE 0.9 % IV SOLN: INTRAVENOUS | Status: DC

## 2019-10-14 MED ORDER — ONDANSETRON HCL 4 MG/2ML IJ SOLN: 4.0000 mg | Freq: Four times a day (QID) | INTRAMUSCULAR | Status: DC | PRN

## 2019-10-14 MED ORDER — MORPHINE SULFATE (PF) 4 MG/ML IV SOLN
4.0000 mg | Freq: Once | INTRAVENOUS | Status: AC
  Administered 2019-10-14: 4 mg via INTRAVENOUS
  Filled 2019-10-14: qty 1

## 2019-10-14 MED ORDER — ACETAMINOPHEN 650 MG RE SUPP: 650.0000 mg | Freq: Four times a day (QID) | RECTAL | Status: DC | PRN

## 2019-10-14 MED ORDER — DEXTROSE 50 % IV SOLN: 12.5000 g | INTRAVENOUS | Status: AC

## 2019-10-14 MED ORDER — FENTANYL CITRATE (PF) 100 MCG/2ML IJ SOLN: 50.0000 ug | Freq: Once | INTRAMUSCULAR | Status: DC

## 2019-10-14 MED ORDER — GADOBUTROL 1 MMOL/ML IV SOLN
10.0000 mL | Freq: Once | INTRAVENOUS | Status: AC | PRN
  Administered 2019-10-14: 10 mL via INTRAVENOUS

## 2019-10-14 MED ORDER — IOHEXOL 300 MG/ML  SOLN
100.0000 mL | Freq: Once | INTRAMUSCULAR | Status: AC | PRN
  Administered 2019-10-14: 100 mL via INTRAVENOUS

## 2019-10-14 MED ORDER — FENTANYL CITRATE (PF) 100 MCG/2ML IJ SOLN: 25.0000 ug | INTRAMUSCULAR | Status: DC | PRN

## 2019-10-14 NOTE — ED Provider Notes (Signed)
[facility] DEPT Provider Note   CSN: [id number] Arrival date & time: 10/14/19  0093     History Chief Complaint  Patient presents with   Abdominal Pain    to be admitted    Ann Mcguire is a 69 y.o. female.  Pt presents to the ED today with abdominal pain.  Pt was seen initially at [facility] last night and transferred to [facility].  Pt was diagnosed with cholelithiasis and a dilated CBD at 10 mm.  Pt was discussed with Dr. Michail Sermon [facility]) who recommended admission to medicine and a MRCP.  Pt had to go home to get things situated at home, but promised to come back.  She is back now and ready for admission.        Past Medical History:  Diagnosis Date   Chicken pox    Fainting spell    Hypertension     Patient Active Problem List   Diagnosis Date Noted   Abnormal urine 11/11/2017   Neutropenia ([place]) 11/11/2017   Trochanteric bursitis of left hip 11/01/2017   Pyuria 11/01/2017   Medically noncompliant 11/01/2017   Essential hypertension 03/22/2017   Type 2 diabetes mellitus without complication, without long-term current use of insulin ([place]) 03/22/2017   Vitamin D deficiency 03/22/2017   Screening for osteoporosis 03/22/2017    No past surgical history on file.   OB History   No obstetric history on file.     Family History  Problem Relation Age of Onset   Hypertension Sister     Social History   Tobacco Use   Smoking status: Never Smoker   Smokeless tobacco: Never Used  Substance Use Topics   Alcohol use: No   Drug use: No    Home Medications Prior to Admission medications   Medication Sig Start Date End Date Taking? Authorizing Provider  dicyclomine (BENTYL) 10 MG capsule Take 1 capsule (10 mg total) by mouth 4 (four) times daily -  before meals and at bedtime. 10/14/19   Palumbo, April, MD  hydrochlorothiazide (HYDRODIURIL) 25 MG tablet TAKE 1 TABLET(25 MG) BY MOUTH DAILY Patient taking differently: Take  25 mg by mouth daily.  10/02/19   Libby Maw, MD  ibuprofen (ADVIL) 200 MG tablet Take 400 mg by mouth every 6 (six) hours as needed for moderate pain.    [provider]  lisinopril (ZESTRIL) 20 MG tablet Take 1 tablet (20 mg total) by mouth daily. 10/02/19   Libby Maw, MD  metFORMIN (GLUCOPHAGE-XR) 500 MG 24 hr tablet TAKE 1 TABLET(500 MG) BY MOUTH AT BEDTIME Patient taking differently: Take 500 mg by mouth at bedtime.  05/08/19   Libby Maw, MD  Multiple Vitamin (MULTIVITAMIN WITH MINERALS) TABS tablet Take 1 tablet by mouth daily.    [provider]  omeprazole (PRILOSEC) 40 MG capsule Take 1 capsule (40 mg total) by mouth daily. 10/02/19   Libby Maw, MD    Allergies    Patient has no known allergies.  Review of Systems   Review of Systems  Gastrointestinal: Positive for abdominal pain.  All other systems reviewed and are negative.   Physical Exam Updated Vital Signs BP (!) 171/91    Pulse 88    Temp 98.3 F (36.8 C) (Oral)    Resp 17    SpO2 97%   Physical Exam Vitals and nursing note reviewed.  Constitutional:      Appearance: She is well-developed.  HENT:  Head: Normocephalic and atraumatic.     Mouth/Throat:     Mouth: Mucous membranes are moist.     Pharynx: Oropharynx is clear.  Eyes:     Extraocular Movements: Extraocular movements intact.     Pupils: Pupils are equal, round, and reactive to light.  Cardiovascular:     Rate and Rhythm: Normal rate and regular rhythm.  Pulmonary:     Effort: Pulmonary effort is normal.     Breath sounds: Normal breath sounds.  Abdominal:     General: Abdomen is flat.     Palpations: Abdomen is soft.     Tenderness: There is abdominal tenderness in the right upper quadrant.  Skin:    General: Skin is warm.     Capillary Refill: Capillary refill takes less than 2 seconds.  Neurological:     General: No focal deficit present.     Mental Status: She is alert and  oriented to person, place, and time.  Psychiatric:        Mood and Affect: Mood normal.        Behavior: Behavior normal.     ED Results / Procedures / Treatments   Labs (all labs ordered are listed, but only abnormal results are displayed) Labs Reviewed  COMPREHENSIVE METABOLIC PANEL  CBC WITH DIFFERENTIAL/PLATELET  LIPASE, BLOOD    EKG None  Radiology CT ABDOMEN PELVIS W CONTRAST  Result Date: 10/14/2019 CLINICAL DATA:  Right upper quadrant pain for several months EXAM: CT ABDOMEN AND PELVIS WITH CONTRAST TECHNIQUE: Multidetector CT imaging of the abdomen and pelvis was performed using the standard protocol following bolus administration of intravenous contrast. CONTRAST:  144mL OMNIPAQUE IOHEXOL 300 MG/ML  SOLN COMPARISON:  None. FINDINGS: Lower chest: Minimal right basilar atelectasis is noted. Hepatobiliary: Gallbladder is well distended with dependent density likely related to a combination of small gallstones and gallbladder sludge. No definitive wall thickening is seen. Mild central intrahepatic ductal dilatation is noted. The liver is otherwise within normal limits. Common bile duct is prominent measuring up to 8 mm. This is suspicious for an underlying distal common bile duct stone although no definitive calcification is seen. Pancreas: Pancreas shows no focal mass. Very mild central dilatation of the pancreatic duct is noted which may be related to the distal common bile duct dilatation. Again this is suspicious for small distal stone. Spleen: Normal in size without focal abnormality. Adrenals/Urinary Tract: Adrenal glands are within normal limits. Kidneys are within normal limits. No renal calculi are noted. No obstructive changes are seen. The bladder is partially distended. Stomach/Bowel: The appendix is within normal limits. No obstructive or inflammatory changes of the large or small bowel are seen. Stomach is within normal limits. Vascular/Lymphatic: No significant vascular  findings are present. No enlarged abdominal or pelvic lymph nodes. Reproductive: Uterus is within normal limits. Large left ovarian cyst is noted which appears simple in nature. This measures 7.7 cm in dimension. Other: No abdominal wall hernia or abnormality. No abdominopelvic ascites. Musculoskeletal: Degenerative changes of lumbar spine are noted. IMPRESSION: Dilation of the gallbladder with mild central biliary ductal dilatation and common bile duct dilatation. Changes of gallbladder sludge and small stones are noted. These changes suggest a distal common bile duct stone. Ultrasound may be helpful for further evaluation. Large left ovarian cyst measuring 7.7 cm. Nonemergent ultrasound may be helpful to clarify the simple versus complex nature of the cyst. Electronically Signed   By: Inez Catalina M.D.   On: 10/14/2019 00:44   US Abdomen  Limited RUQ  Result Date: 10/14/2019 CLINICAL DATA:  Follow-up abnormal gallbladder on recent CT examination. EXAM: ULTRASOUND ABDOMEN LIMITED RIGHT UPPER QUADRANT COMPARISON:  CT from earlier in the same day. FINDINGS: Gallbladder: Gallbladder is well distended. Gallbladder sludge and stones are noted similar to that seen on prior CT. Negative sonographic Murphy's sign is noted. No wall thickening is seen. No pericholecystic fluid is noted. Common bile duct: Diameter: 10 mm. This is consistent with that seen on recent CT and suggestive of a distal common bile duct stone. Liver: Diffusely increased in echogenicity consistent with fatty infiltration. No intrahepatic biliary ductal dilatation is seen. Portal vein is patent on color Doppler imaging with normal direction of blood flow towards the liver. Other: None. IMPRESSION: Cholelithiasis and gallbladder sludge without complicating factors. Dilated common bile duct suspicious for distal stone similar to that seen on the prior exam. Electronically Signed   By: Inez Catalina M.D.   On: 10/14/2019 03:48    Procedures Procedures  (including critical care time)  Medications Ordered in ED Medications  morphine 4 MG/ML injection 4 mg (has no administration in time range)  ondansetron (ZOFRAN) injection 4 mg (has no administration in time range)    ED Course  I have reviewed the triage vital signs and the nursing notes.  Pertinent labs & imaging results that were available during my care of the patient were reviewed by me and considered in my medical decision making (see chart for details).    MDM Rules/Calculators/A&P                          Pt d/w Dr. Nicoletta Dress (triad) for admission.  Labs earlier today ok.  Covid neg.  Final Clinical Impression(s) / ED Diagnoses Final diagnoses:  Calculus of bile duct without cholecystitis and without obstruction    Rx / DC Orders ED Discharge Orders    None       Isla Pence, MD 10/14/19 1145

## 2019-10-14 NOTE — ED Notes (Signed)
Pt is in MRI at this time. Will call report to have pt transported upstairs once she returns from MRI.

## 2019-10-14 NOTE — ED Provider Notes (Signed)
Results for orders placed or performed during the hospital encounter of 10/13/19  SARS Coronavirus 2 by RT PCR (hospital order, performed in [facility] hospital lab) Nasopharyngeal Nasopharyngeal Swab   Specimen: Nasopharyngeal Swab  Result Value Ref Range   SARS Coronavirus 2 NEGATIVE NEGATIVE  Lipase, blood  Result Value Ref Range   Lipase 30 11 - 51 U/L  Comprehensive metabolic panel  Result Value Ref Range   Sodium 141 135 - 145 mmol/L   Potassium 3.9 3.5 - 5.1 mmol/L   Chloride 105 98 - 111 mmol/L   CO2 25 22 - 32 mmol/L   Glucose, Bld 106 (H) 70 - 99 mg/dL   BUN 15 8 - 23 mg/dL   Creatinine, Ser 1.20 (H) 0.44 - 1.00 mg/dL   Calcium 9.2 8.9 - 10.3 mg/dL   Total Protein 7.8 6.5 - 8.1 g/dL   Albumin 4.1 3.5 - 5.0 g/dL   AST 21 15 - 41 U/L   ALT 17 0 - 44 U/L   Alkaline Phosphatase 61 38 - 126 U/L   Total Bilirubin 1.0 0.3 - 1.2 mg/dL   GFR calc non Af Amer 46 (L) >60 mL/min   GFR calc Af Amer 53 (L) >60 mL/min   Anion gap 11 5 - 15  CBC  Result Value Ref Range   WBC 5.6 4.0 - 10.5 K/uL   RBC 5.05 3.87 - 5.11 MIL/uL   Hemoglobin 14.0 12.0 - 15.0 g/dL   HCT 44.0 36 - 46 %   MCV 87.1 80.0 - 100.0 fL   MCH 27.7 26.0 - 34.0 pg   MCHC 31.8 30.0 - 36.0 g/dL   RDW 14.5 11.5 - 15.5 %   Platelets 329 150 - 400 K/uL   nRBC 0.0 0.0 - 0.2 %  Urinalysis, Routine w reflex microscopic  Result Value Ref Range   Color, Urine YELLOW YELLOW   APPearance CLEAR CLEAR   Specific Gravity, Urine 1.020 1.005 - 1.030   pH 5.5 5.0 - 8.0   Glucose, UA NEGATIVE NEGATIVE mg/dL   Hgb urine dipstick SMALL (A) NEGATIVE   Bilirubin Urine NEGATIVE NEGATIVE   Ketones, ur NEGATIVE NEGATIVE mg/dL   Protein, ur NEGATIVE NEGATIVE mg/dL   Nitrite NEGATIVE NEGATIVE   Leukocytes,Ua MODERATE (A) NEGATIVE  Urinalysis, Microscopic (reflex)  Result Value Ref Range   RBC / HPF 6-10 0 - 5 RBC/hpf   WBC, UA 11-20 0 - 5 WBC/hpf   Bacteria, UA FEW (A) NONE SEEN   Squamous Epithelial / LPF 0-5 0 - 5   CT  ABDOMEN PELVIS W CONTRAST  Result Date: 10/14/2019 CLINICAL DATA:  Right upper quadrant pain for several months EXAM: CT ABDOMEN AND PELVIS WITH CONTRAST TECHNIQUE: Multidetector CT imaging of the abdomen and pelvis was performed using the standard protocol following bolus administration of intravenous contrast. CONTRAST:  157mL OMNIPAQUE IOHEXOL 300 MG/ML  SOLN COMPARISON:  None. FINDINGS: Lower chest: Minimal right basilar atelectasis is noted. Hepatobiliary: Gallbladder is well distended with dependent density likely related to a combination of small gallstones and gallbladder sludge. No definitive wall thickening is seen. Mild central intrahepatic ductal dilatation is noted. The liver is otherwise within normal limits. Common bile duct is prominent measuring up to 8 mm. This is suspicious for an underlying distal common bile duct stone although no definitive calcification is seen. Pancreas: Pancreas shows no focal mass. Very mild central dilatation of the pancreatic duct is noted which may be related to the distal common bile duct  dilatation. Again this is suspicious for small distal stone. Spleen: Normal in size without focal abnormality. Adrenals/Urinary Tract: Adrenal glands are within normal limits. Kidneys are within normal limits. No renal calculi are noted. No obstructive changes are seen. The bladder is partially distended. Stomach/Bowel: The appendix is within normal limits. No obstructive or inflammatory changes of the large or small bowel are seen. Stomach is within normal limits. Vascular/Lymphatic: No significant vascular findings are present. No enlarged abdominal or pelvic lymph nodes. Reproductive: Uterus is within normal limits. Large left ovarian cyst is noted which appears simple in nature. This measures 7.7 cm in dimension. Other: No abdominal wall hernia or abnormality. No abdominopelvic ascites. Musculoskeletal: Degenerative changes of lumbar spine are noted. IMPRESSION: Dilation of the  gallbladder with mild central biliary ductal dilatation and common bile duct dilatation. Changes of gallbladder sludge and small stones are noted. These changes suggest a distal common bile duct stone. Ultrasound may be helpful for further evaluation. Large left ovarian cyst measuring 7.7 cm. Nonemergent ultrasound may be helpful to clarify the simple versus complex nature of the cyst. Electronically Signed   By: Inez Catalina M.D.   On: 10/14/2019 00:44   US Abdomen Limited RUQ  Result Date: 10/14/2019 CLINICAL DATA:  Follow-up abnormal gallbladder on recent CT examination. EXAM: ULTRASOUND ABDOMEN LIMITED RIGHT UPPER QUADRANT COMPARISON:  CT from earlier in the same day. FINDINGS: Gallbladder: Gallbladder is well distended. Gallbladder sludge and stones are noted similar to that seen on prior CT. Negative sonographic Murphy's sign is noted. No wall thickening is seen. No pericholecystic fluid is noted. Common bile duct: Diameter: 10 mm. This is consistent with that seen on recent CT and suggestive of a distal common bile duct stone. Liver: Diffusely increased in echogenicity consistent with fatty infiltration. No intrahepatic biliary ductal dilatation is seen. Portal vein is patent on color Doppler imaging with normal direction of blood flow towards the liver. Other: None. IMPRESSION: Cholelithiasis and gallbladder sludge without complicating factors. Dilated common bile duct suspicious for distal stone similar to that seen on the prior exam. Electronically Signed   By: Inez Catalina M.D.   On: 10/14/2019 03:48    415 case d/w Dr. Michail Sermon who recommends pain control and MRCP.  Please admit to medicine   Patient continued to have pain but will not stay for admission as she has things to do.    She is alert and oriented x4 and has decision making capacity to refuse care.  She understands that the risks of signing AMA are but are not limited to death, sepsis, bowel obstruction and or perforation, cholangitis  and prolonged pain.  Patient verbalizes understanding of these risks and is signing out against medical advice.  She is welcome to return at any time.        Jamielynn Wigley, MD 10/14/19 [phone]

## 2019-10-14 NOTE — Progress Notes (Signed)
Patients blood glucose 67 given 1/2 ampule of D50 and blood glucose rechecked 15 minutes later and was 117 . MD notified new orders placed.

## 2019-10-14 NOTE — ED Triage Notes (Signed)
Pt reports that she had abd pains for a while and was seen at [facility] yesterday and transferred here for ultrasound on her abd. Reports that she had to go home to get stuff straightened out before she could be admitted to the hospital. Pt states that she is now back to be admitted to the hospital.

## 2019-10-14 NOTE — Progress Notes (Signed)
Report received 

## 2019-10-14 NOTE — ED Notes (Signed)
Consult to Gastroentrology@04 :13am.

## 2019-10-14 NOTE — Consult Note (Signed)
Referring Provider: Dr. Charlann Lange Primary Care Physician:  Libby Maw, MD Primary Gastroenterologist:  Althia Forts Reason for Consultation:  Cholelithiasis, CBD dilation  HPI: Ann Mcguire is a 69 y.o. female with history of hypertension presenting with a chief complaint of abdominal pain.  Patient reports worsening right upper quadrant abdominal pain over the past few days.  She presented to the ED because she stated the pain was severe and 10/10.  She has also had nausea but denies any recent vomiting.  She reports intermittent right upper quadrant abdominal pain occurring for several years, but worsening over the past 3 months.  Typically, her symptoms are aggravated by food, but she cannot identify certain food triggers.  However, she did note that her most recent episode was triggered by grits with cheese.  In the past, she has had nausea and vomiting associated with right upper quadrant pain.  Denies any hematemesis.  Patient denies any changes in appetite, unexplained weight loss, dysphagia, GERD, changes in bowel movements, constipation, diarrhea, melena, or hematochezia.  Patient denies any family history of gallbladder issues, gastrointestinal malignancies, or colon cancer.  Denies any blood thinner use.  Patient states she has had a colonoscopy, but she is "overdue" for her next colonoscopy.  Last colonoscopy was in [facility].    Past Medical History:  Diagnosis Date  . Chicken pox   . Fainting spell   . Hypertension     History reviewed. No pertinent surgical history.  Prior to Admission medications   Medication Sig Start Date End Date Taking? Authorizing Provider  dicyclomine (BENTYL) 10 MG capsule Take 1 capsule (10 mg total) by mouth 4 (four) times daily -  before meals and at bedtime. 10/14/19   Palumbo, April, MD  hydrochlorothiazide (HYDRODIURIL) 25 MG tablet TAKE 1 TABLET(25 MG) BY MOUTH DAILY Patient taking differently: Take 25 mg by mouth daily.  10/02/19    Libby Maw, MD  ibuprofen (ADVIL) 200 MG tablet Take 400 mg by mouth every 6 (six) hours as needed for moderate pain.    [provider]  lisinopril (ZESTRIL) 20 MG tablet Take 1 tablet (20 mg total) by mouth daily. 10/02/19   Libby Maw, MD  metFORMIN (GLUCOPHAGE-XR) 500 MG 24 hr tablet TAKE 1 TABLET(500 MG) BY MOUTH AT BEDTIME Patient taking differently: Take 500 mg by mouth at bedtime.  05/08/19   Libby Maw, MD  Multiple Vitamin (MULTIVITAMIN WITH MINERALS) TABS tablet Take 1 tablet by mouth daily.    [provider]  omeprazole (PRILOSEC) 40 MG capsule Take 1 capsule (40 mg total) by mouth daily. 10/02/19   Libby Maw, MD    Scheduled Meds: . dicyclomine  10 mg Oral TID AC & HS   Continuous Infusions: . sodium chloride 75 mL/hr at 10/14/19 1602   PRN Meds:.acetaminophen **OR** acetaminophen, ondansetron **OR** ondansetron (ZOFRAN) IV  Allergies as of 10/14/2019  . (No Known Allergies)    Family History  Problem Relation Age of Onset  . Hypertension Sister     Social History   Socioeconomic History  . Marital status: Divorced    Spouse name: Not on file  . Number of children: Not on file  . Years of education: Not on file  . Highest education level: Not on file  Occupational History  . Not on file  Tobacco Use  . Smoking status: Never Smoker  . Smokeless tobacco: Never Used  Substance and Sexual Activity  . Alcohol use: No  .  Drug use: No  . Sexual activity: Not on file  Other Topics Concern  . Not on file  Social History Narrative  . Not on file   Social Determinants of Health   Financial Resource Strain:   . Difficulty of Paying Living Expenses:   Food Insecurity:   . Worried About Charity fundraiser in the Last Year:   . Arboriculturist in the Last Year:   Transportation Needs:   . Film/video editor (Medical):   Marland Kitchen Lack of Transportation (Non-Medical):   Physical Activity:   . Days of  Exercise per Week:   . Minutes of Exercise per Session:   Stress:   . Feeling of Stress :   Social Connections:   . Frequency of Communication with Friends and Family:   . Frequency of Social Gatherings with Friends and Family:   . Attends Religious Services:   . Active Member of Clubs or Organizations:   . Attends Archivist Meetings:   Marland Kitchen Marital Status:   Intimate Partner Violence:   . Fear of Current or Ex-Partner:   . Emotionally Abused:   Marland Kitchen Physically Abused:   . Sexually Abused:     Review of Systems: Review of Systems  Constitutional: Negative for chills, fever and weight loss.  HENT: Negative for hearing loss and tinnitus.   Eyes: Negative for pain and redness.  Respiratory: Negative for cough and shortness of breath.   Cardiovascular: Negative for chest pain and palpitations.  Gastrointestinal: Positive for abdominal pain (RUQ) and nausea. Negative for blood in stool, constipation, diarrhea, heartburn, melena and vomiting.  Genitourinary: Negative for flank pain and hematuria.  Musculoskeletal: Negative for falls and joint pain.  Skin: Negative for itching and rash.  Neurological: Negative for seizures and loss of consciousness.  Endo/Heme/Allergies: Negative for polydipsia. Does not bruise/bleed easily.  Psychiatric/Behavioral: Negative for substance abuse. The patient is not nervous/anxious.     Physical Exam: Vital signs: Vitals:   10/14/19 1532 10/14/19 1606  BP:  (!) 146/89  Pulse: 63   Resp:  16  Temp:  97.9 F (36.6 C)  SpO2: 96% 99%   Last BM Date: 10/12/19  Physical Exam Constitutional:      General: She is not in acute distress.    Appearance: Normal appearance. She is well-developed.  HENT:     Head: Normocephalic and atraumatic.     Mouth/Throat:     Mouth: Mucous membranes are moist.     Pharynx: Oropharynx is clear.  Eyes:     General: No scleral icterus.    Extraocular Movements: Extraocular movements intact.  Cardiovascular:      Rate and Rhythm: Normal rate and regular rhythm.     Heart sounds: Normal heart sounds.  Pulmonary:     Effort: Pulmonary effort is normal. No respiratory distress.     Breath sounds: Normal breath sounds.  Abdominal:     General: Bowel sounds are normal. There is no distension.     Palpations: Abdomen is soft. There is no mass.     Tenderness: There is abdominal tenderness (RUQ). There is no guarding or rebound.     Hernia: No hernia is present.  Musculoskeletal:        General: No swelling or tenderness.     Cervical back: Normal range of motion and neck supple.  Skin:    General: Skin is warm and dry.     Coloration: Skin is not jaundiced.  Neurological:  General: No focal deficit present.     Mental Status: She is alert and oriented to person, place, and time.  Psychiatric:        Mood and Affect: Mood normal.        Behavior: Behavior normal.      GI:  Lab Results: Recent Labs    10/13/19 2135 10/14/19 1216  WBC 5.6 5.1  HGB 14.0 12.9  HCT 44.0 41.4  PLT 329 320   BMET Recent Labs    10/13/19 2135 10/14/19 1216  NA 141 143  K 3.9 4.1  CL 105 106  CO2 25 24  GLUCOSE 106* 96  BUN 15 14  CREATININE 1.20* 1.26*  CALCIUM 9.2 9.4   LFT Recent Labs    10/14/19 1216  PROT 7.2  ALBUMIN 3.8  AST 86*  ALT 55*  ALKPHOS 70  BILITOT 0.8   PT/INR No results for input(s): LABPROT, INR in the last 72 hours.   Studies/Results: CT ABDOMEN PELVIS W CONTRAST  Result Date: 10/14/2019 CLINICAL DATA:  Right upper quadrant pain for several months EXAM: CT ABDOMEN AND PELVIS WITH CONTRAST TECHNIQUE: Multidetector CT imaging of the abdomen and pelvis was performed using the standard protocol following bolus administration of intravenous contrast. CONTRAST:  119mL OMNIPAQUE IOHEXOL 300 MG/ML  SOLN COMPARISON:  None. FINDINGS: Lower chest: Minimal right basilar atelectasis is noted. Hepatobiliary: Gallbladder is well distended with dependent density likely related  to a combination of small gallstones and gallbladder sludge. No definitive wall thickening is seen. Mild central intrahepatic ductal dilatation is noted. The liver is otherwise within normal limits. Common bile duct is prominent measuring up to 8 mm. This is suspicious for an underlying distal common bile duct stone although no definitive calcification is seen. Pancreas: Pancreas shows no focal mass. Very mild central dilatation of the pancreatic duct is noted which may be related to the distal common bile duct dilatation. Again this is suspicious for small distal stone. Spleen: Normal in size without focal abnormality. Adrenals/Urinary Tract: Adrenal glands are within normal limits. Kidneys are within normal limits. No renal calculi are noted. No obstructive changes are seen. The bladder is partially distended. Stomach/Bowel: The appendix is within normal limits. No obstructive or inflammatory changes of the large or small bowel are seen. Stomach is within normal limits. Vascular/Lymphatic: No significant vascular findings are present. No enlarged abdominal or pelvic lymph nodes. Reproductive: Uterus is within normal limits. Large left ovarian cyst is noted which appears simple in nature. This measures 7.7 cm in dimension. Other: No abdominal wall hernia or abnormality. No abdominopelvic ascites. Musculoskeletal: Degenerative changes of lumbar spine are noted. IMPRESSION: Dilation of the gallbladder with mild central biliary ductal dilatation and common bile duct dilatation. Changes of gallbladder sludge and small stones are noted. These changes suggest a distal common bile duct stone. Ultrasound may be helpful for further evaluation. Large left ovarian cyst measuring 7.7 cm. Nonemergent ultrasound may be helpful to clarify the simple versus complex nature of the cyst. Electronically Signed   By: Inez Catalina M.D.   On: 10/14/2019 00:44   MR 3D Recon At Scanner  Result Date: 10/14/2019 CLINICAL DATA:  Nausea,  right upper quadrant pain since Saturday, cholelithiasis EXAM: MRI ABDOMEN WITHOUT AND WITH CONTRAST (INCLUDING MRCP) TECHNIQUE: Multiplanar multisequence MR imaging of the abdomen was performed both before and after the administration of intravenous contrast. Heavily T2-weighted images of the biliary and pancreatic ducts were obtained, and three-dimensional MRCP images were rendered by post  processing. CONTRAST:  61mL GADAVIST GADOBUTROL 1 MMOL/ML IV SOLN COMPARISON:  CT abdomen pelvis, 10/14/2019, abdominal ultrasound, 10/14/2019 FINDINGS: Lower chest: No acute findings. Hepatobiliary: No mass or other parenchymal abnormality identified. Numerous gallstones in the gallbladder, which is mildly distended. No gallbladder wall thickening or pericholecystic fluid. There is mild intrahepatic biliary ductal dilatation and dilatation of the common bile duct up to 1.2 cm without filling defect visualized to the ampulla. Pancreas: No mass, inflammatory changes, or other parenchymal abnormality identified. The pancreatic duct is mildly prominent, measuring up to 3 mm in caliber. Spleen:  Within normal limits in size and appearance. Adrenals/Urinary Tract: No masses identified. No evidence of hydronephrosis. Stomach/Bowel: Visualized portions within the abdomen are unremarkable. Vascular/Lymphatic: No pathologically enlarged lymph nodes identified. No abdominal aortic aneurysm demonstrated. Other:  None. Musculoskeletal: No suspicious bone lesions identified. IMPRESSION: 1. Numerous gallstones in the gallbladder, which is mildly distended. No gallbladder wall thickening or pericholecystic fluid. 2. Mild intrahepatic biliary ductal dilatation and dilatation of the common bile duct up to 1.2 cm without filling defect visualized to the ampulla. 3. The pancreatic duct is mildly prominent, measuring up to 3 mm in caliber. Again, no obstructing lesion identified. 4. Patency of the cystic and common bile ducts may be evaluated by  HIDA or ERCP if desired. Electronically Signed   By: Eddie Candle M.D.   On: 10/14/2019 15:37   MR ABDOMEN MRCP W WO CONTAST  Result Date: 10/14/2019 CLINICAL DATA:  Nausea, right upper quadrant pain since Saturday, cholelithiasis EXAM: MRI ABDOMEN WITHOUT AND WITH CONTRAST (INCLUDING MRCP) TECHNIQUE: Multiplanar multisequence MR imaging of the abdomen was performed both before and after the administration of intravenous contrast. Heavily T2-weighted images of the biliary and pancreatic ducts were obtained, and three-dimensional MRCP images were rendered by post processing. CONTRAST:  1mL GADAVIST GADOBUTROL 1 MMOL/ML IV SOLN COMPARISON:  CT abdomen pelvis, 10/14/2019, abdominal ultrasound, 10/14/2019 FINDINGS: Lower chest: No acute findings. Hepatobiliary: No mass or other parenchymal abnormality identified. Numerous gallstones in the gallbladder, which is mildly distended. No gallbladder wall thickening or pericholecystic fluid. There is mild intrahepatic biliary ductal dilatation and dilatation of the common bile duct up to 1.2 cm without filling defect visualized to the ampulla. Pancreas: No mass, inflammatory changes, or other parenchymal abnormality identified. The pancreatic duct is mildly prominent, measuring up to 3 mm in caliber. Spleen:  Within normal limits in size and appearance. Adrenals/Urinary Tract: No masses identified. No evidence of hydronephrosis. Stomach/Bowel: Visualized portions within the abdomen are unremarkable. Vascular/Lymphatic: No pathologically enlarged lymph nodes identified. No abdominal aortic aneurysm demonstrated. Other:  None. Musculoskeletal: No suspicious bone lesions identified. IMPRESSION: 1. Numerous gallstones in the gallbladder, which is mildly distended. No gallbladder wall thickening or pericholecystic fluid. 2. Mild intrahepatic biliary ductal dilatation and dilatation of the common bile duct up to 1.2 cm without filling defect visualized to the ampulla. 3. The  pancreatic duct is mildly prominent, measuring up to 3 mm in caliber. Again, no obstructing lesion identified. 4. Patency of the cystic and common bile ducts may be evaluated by HIDA or ERCP if desired. Electronically Signed   By: Eddie Candle M.D.   On: 10/14/2019 15:37   US Abdomen Limited RUQ  Result Date: 10/14/2019 CLINICAL DATA:  Follow-up abnormal gallbladder on recent CT examination. EXAM: ULTRASOUND ABDOMEN LIMITED RIGHT UPPER QUADRANT COMPARISON:  CT from earlier in the same day. FINDINGS: Gallbladder: Gallbladder is well distended. Gallbladder sludge and stones are noted similar to that seen on prior  CT. Negative sonographic Murphy's sign is noted. No wall thickening is seen. No pericholecystic fluid is noted. Common bile duct: Diameter: 10 mm. This is consistent with that seen on recent CT and suggestive of a distal common bile duct stone. Liver: Diffusely increased in echogenicity consistent with fatty infiltration. No intrahepatic biliary ductal dilatation is seen. Portal vein is patent on color Doppler imaging with normal direction of blood flow towards the liver. Other: None. IMPRESSION: Cholelithiasis and gallbladder sludge without complicating factors. Dilated common bile duct suspicious for distal stone similar to that seen on the prior exam. Electronically Signed   By: Inez Catalina M.D.   On: 10/14/2019 03:48    Impression: Cholelithiasis and dilated CBD -MRCP today showed: numerous gallstones in the gallbladder, which is mildly distended. No gallbladder wall thickening or pericholecystic fluid. Mild intrahepatic biliary ductal dilatation and dilatation of the common bile duct up to 1.2 cm without filling defect visualized to the ampulla. The pancreatic duct is mildly prominent, measuring up to 3 mm in caliber. Again, no obstructing lesion identified.  Patency of the cystic and common bile ducts may be evaluated by HIDA or ERCP if desired. -T bili 0.8/AST 86/ALT 55/ALP 70 -No  leukocytosis: WBCs 5.1 -Lipase within normal limits (36)  Plan: Based on MRCP findings, as well as LFTs, we do not currently suspect a CBD stone.  It is possible patient passed a stone.  Due to CBD dilation, ordered a HIDA scan tomorrow for further evaluation.  Discussed with Dr. Michail Sermon, do not believe that an ERCP is necessary at this time.  Recommend surgical consultation in the morning for symptomatic cholelithiasis and consideration of laparoscopic cholecystectomy with IOC.  Clear liquid diet okay tonight with NPO after midnight.  Continue to trend LFTs.  [facility] will follow.   LOS: 0 days   Salley Slaughter  PA-C 10/14/2019, 5:08 PM  Contact #  [phone]

## 2019-10-14 NOTE — H&P (Signed)
History and Physical    Ann Mcguire QHU:[id number] DOB: [birth year] DOA: 10/14/2019  PCP: Libby Maw, MD Patient coming from: Home  Chief Complaint: Abdominal pain  HPI: Ann Mcguire is a 69 y.o. female with medical history significant of with a PMH of T2DM, HTN and obesity with BMI 34.06 who presented with abdominal pain.  She has completed Covid-19 vaccines.  Patient reports that she started to have progressively worsening of abdominal pain 4 days ago.  Her abdomen pain was located to RUQ abdomen area, constant and it waxed and waned pain, sharp pain, 10/10 with no radiation.  Associated symptoms included nausea but no vomiting or diarrhea.  Denies fever or chills.  Eating food aggravates the pain, and ibuprofen alleviates the pain.  Patient did not seek medical attention until last night when she went to [facility] ED.  In the [facility] ED her labs showed normal LFTs and lipase, nonrevealing CBC.  CT abdomen showed Dilation of the gallbladder with mild central biliary ductal dilatation and common bile duct dilatation. Changes of gallbladder sludge and small stones are noted. Large left ovarian cyst measuring 7.7 cm.  Ultrasound of abdomen showed Cholelithiasis and gallbladder sludge without complicating factors. Dilated common bile duct suspicious for distal stone. She was referred to [facility] ED for further evaluation.  The ED provider discussed with [facility] Dr. Michail Sermon who recommended pain control and MRCP.  Patient is currently pain-free.    Review of Systems: As per HPI otherwise 10 point review of systems negative.  Review of Systems Otherwise negative except as per HPI, including: General: Denies fever, chills, night sweats or unintended weight loss. Resp: Denies cough, wheezing, shortness of breath. Cardiac: Denies chest pain, palpitations, orthopnea, paroxysmal nocturnal dyspnea. GI: Denies vomiting, diarrhea or constipation.  Positive for abdominal pain and  nausea GU: Denies dysuria, frequency, hesitancy or incontinence MS: Denies muscle aches, joint pain or swelling Neuro: Denies headache, neurologic deficits (focal weakness, numbness, tingling), abnormal gait Psych: Denies anxiety, depression, SI/HI/AVH Skin: Denies new rashes or lesions ID: Denies sick contacts, exotic exposures, travel  Past Medical History:  Diagnosis Date  . Chicken pox   . Fainting spell   . Hypertension     History reviewed. No pertinent surgical history.  SOCIAL HISTORY:  reports that she has never smoked. She has never used smokeless tobacco. She reports that she does not drink alcohol and does not use drugs.  No Known Allergies  FAMILY HISTORY: Family History  Problem Relation Age of Onset  . Hypertension Sister      Prior to Admission medications   Medication Sig Start Date End Date Taking? Authorizing Provider  dicyclomine (BENTYL) 10 MG capsule Take 1 capsule (10 mg total) by mouth 4 (four) times daily -  before meals and at bedtime. 10/14/19   Palumbo, April, MD  hydrochlorothiazide (HYDRODIURIL) 25 MG tablet TAKE 1 TABLET(25 MG) BY MOUTH DAILY Patient taking differently: Take 25 mg by mouth daily.  10/02/19   Libby Maw, MD  ibuprofen (ADVIL) 200 MG tablet Take 400 mg by mouth every 6 (six) hours as needed for moderate pain.    [provider]  lisinopril (ZESTRIL) 20 MG tablet Take 1 tablet (20 mg total) by mouth daily. 10/02/19   Libby Maw, MD  metFORMIN (GLUCOPHAGE-XR) 500 MG 24 hr tablet TAKE 1 TABLET(500 MG) BY MOUTH AT BEDTIME Patient taking differently: Take 500 mg by mouth at bedtime.  05/08/19   Libby Maw, MD  Multiple Vitamin (MULTIVITAMIN WITH MINERALS) TABS tablet Take 1 tablet by mouth daily.    [provider]  omeprazole (PRILOSEC) 40 MG capsule Take 1 capsule (40 mg total) by mouth daily. 10/02/19   Libby Maw, MD    Physical Exam: Vitals:   10/14/19 0948 10/14/19  1155 10/14/19 1359  BP: (!) 171/91 (!) 147/84 (!) 176/82  Pulse: 88 (!) 57 (!) 53  Resp: 17 16 18   Temp: 98.3 F (36.8 C) 97.6 F (36.4 C)   TempSrc: Oral Oral   SpO2: 97% 96% 95%      Constitutional: NAD, calm, comfortable Eyes: PERRL, lids and conjunctivae normal ENMT: Mucous membranes are moist. Posterior pharynx clear of any exudate or lesions.Normal dentition.  Neck: normal, supple, no masses, no thyromegaly Respiratory: clear to auscultation bilaterally, no wheezing, no crackles. Normal respiratory effort. No accessory muscle use.  Cardiovascular: Regular rate and rhythm, no murmurs / rubs / gallops. No extremity edema. 2+ pedal pulses. No carotid bruits.  Abdomen: Tenderness to palpation to right upper quadrant abdomen with no guarding or rebound tenderness.no masses palpated. No hepatosplenomegaly. Bowel sounds positive.  Musculoskeletal: no clubbing / cyanosis. No joint deformity upper and lower extremities. Good ROM, no contractures. Normal muscle tone.  Skin: no rashes, lesions, ulcers. No induration Neurologic: CN 2-12 grossly intact. Sensation intact, DTR normal. Strength 5/5 in all 4.  Psychiatric: Normal judgment and insight. Alert and oriented x 3. Normal mood.     Labs on Admission: I have personally reviewed following labs and imaging studies  CBC: Recent Labs  Lab 10/13/19 2135 10/14/19 1216  WBC 5.6 5.1  NEUTROABS  --  3.1  HGB 14.0 12.9  HCT 44.0 41.4  MCV 87.1 88.1  PLT 329 734   Basic Metabolic Panel: Recent Labs  Lab 10/13/19 2135 10/14/19 1216  Makaria Poarch 141 143  K 3.9 4.1  CL 105 106  CO2 25 24  GLUCOSE 106* 96  BUN 15 14  CREATININE 1.20* 1.26*  CALCIUM 9.2 9.4   GFR: Estimated Creatinine Clearance: 49.2 mL/min (A) (by C-G formula based on SCr of 1.26 mg/dL (H)). Liver Function Tests: Recent Labs  Lab 10/13/19 2135 10/14/19 1216  AST 21 86*  ALT 17 55*  ALKPHOS 61 70  BILITOT 1.0 0.8  PROT 7.8 7.2  ALBUMIN 4.1 3.8   Recent Labs   Lab 10/13/19 2135 10/14/19 1216  LIPASE 30 36   No results for input(s): AMMONIA in the last 168 hours. Coagulation Profile: No results for input(s): INR, PROTIME in the last 168 hours. Cardiac Enzymes: No results for input(s): CKTOTAL, CKMB, CKMBINDEX, TROPONINI in the last 168 hours. BNP (last 3 results) No results for input(s): PROBNP in the last 8760 hours. HbA1C: No results for input(s): HGBA1C in the last 72 hours. CBG: No results for input(s): GLUCAP in the last 168 hours. Lipid Profile: No results for input(s): CHOL, HDL, LDLCALC, TRIG, CHOLHDL, LDLDIRECT in the last 72 hours. Thyroid Function Tests: No results for input(s): TSH, T4TOTAL, FREET4, T3FREE, THYROIDAB in the last 72 hours. Anemia Panel: No results for input(s): VITAMINB12, FOLATE, FERRITIN, TIBC, IRON, RETICCTPCT in the last 72 hours. Urine analysis:    Component Value Date/Time   COLORURINE YELLOW 10/13/2019 2137   APPEARANCEUR CLEAR 10/13/2019 2137   LABSPEC 1.020 10/13/2019 2137   PHURINE 5.5 10/13/2019 2137   GLUCOSEU NEGATIVE 10/13/2019 2137   GLUCOSEU NEGATIVE 10/02/2019 1002   HGBUR SMALL (A) 10/13/2019 2137   BILIRUBINUR NEGATIVE 10/13/2019 2137  KETONESUR NEGATIVE 10/13/2019 2137   PROTEINUR NEGATIVE 10/13/2019 2137   UROBILINOGEN 0.2 10/02/2019 1002   NITRITE NEGATIVE 10/13/2019 2137   LEUKOCYTESUR MODERATE (A) 10/13/2019 2137   Sepsis Labs: !!!!!!!!!!!!!!!!!!!!!!!!!!!!!!!!!!!!!!!!!!!! @LABRCNTIP (procalcitonin:4,lacticidven:4) ) Recent Results (from the past 240 hour(s))  SARS Coronavirus 2 by RT PCR (hospital order, performed in [place] hospital lab) Nasopharyngeal Nasopharyngeal Swab     Status: None   Collection Time: 10/14/19  1:54 AM   Specimen: Nasopharyngeal Swab  Result Value Ref Range Status   SARS Coronavirus 2 NEGATIVE NEGATIVE Final    Comment: (NOTE) SARS-CoV-2 target nucleic acids are NOT DETECTED.  The SARS-CoV-2 RNA is generally detectable in upper and  lower respiratory specimens during the acute phase of infection. The lowest concentration of SARS-CoV-2 viral copies this assay can detect is 250 copies / mL. A negative result does not preclude SARS-CoV-2 infection and should not be used as the sole basis for treatment or other patient management decisions.  A negative result may occur with improper specimen collection / handling, submission of specimen other than nasopharyngeal swab, presence of viral mutation(s) within the areas targeted by this assay, and inadequate number of viral copies (<250 copies / mL). A negative result must be combined with clinical observations, patient history, and epidemiological information.  Fact Sheet for Patients:   StrictlyIdeas.no  Fact Sheet for Healthcare Providers: BankingDealers.co.za  This test is not yet approved or  cleared by the [place] FDA and has been authorized for detection and/or diagnosis of SARS-CoV-2 by FDA under an Emergency Use Authorization (EUA).  This EUA will remain in effect (meaning this test can be used) for the duration of the COVID-19 declaration under Section 564(b)(1) of the Act, 21 U.S.C. section 360bbb-3(b)(1), unless the authorization is terminated or revoked sooner.  Performed at [facility], [address] [place], [place] [address]      Radiological Exams on Admission: CT ABDOMEN PELVIS W CONTRAST  Result Date: 10/14/2019 CLINICAL DATA:  Right upper quadrant pain for several months EXAM: CT ABDOMEN AND PELVIS WITH CONTRAST TECHNIQUE: Multidetector CT imaging of the abdomen and pelvis was performed using the standard protocol following bolus administration of intravenous contrast. CONTRAST:  147mL OMNIPAQUE IOHEXOL 300 MG/ML  SOLN COMPARISON:  None. FINDINGS: Lower chest: Minimal right basilar atelectasis is noted. Hepatobiliary: Gallbladder is well distended with dependent density likely related to  a combination of small gallstones and gallbladder sludge. No definitive wall thickening is seen. Mild central intrahepatic ductal dilatation is noted. The liver is otherwise within normal limits. Common bile duct is prominent measuring up to 8 mm. This is suspicious for an underlying distal common bile duct stone although no definitive calcification is seen. Pancreas: Pancreas shows no focal mass. Very mild central dilatation of the pancreatic duct is noted which may be related to the distal common bile duct dilatation. Again this is suspicious for small distal stone. Spleen: Normal in size without focal abnormality. Adrenals/Urinary Tract: Adrenal glands are within normal limits. Kidneys are within normal limits. No renal calculi are noted. No obstructive changes are seen. The bladder is partially distended. Stomach/Bowel: The appendix is within normal limits. No obstructive or inflammatory changes of the large or small bowel are seen. Stomach is within normal limits. Vascular/Lymphatic: No significant vascular findings are present. No enlarged abdominal or pelvic lymph nodes. Reproductive: Uterus is within normal limits. Large left ovarian cyst is noted which appears simple in nature. This measures 7.7 cm in dimension. Other: No abdominal  wall hernia or abnormality. No abdominopelvic ascites. Musculoskeletal: Degenerative changes of lumbar spine are noted. IMPRESSION: Dilation of the gallbladder with mild central biliary ductal dilatation and common bile duct dilatation. Changes of gallbladder sludge and small stones are noted. These changes suggest a distal common bile duct stone. Ultrasound may be helpful for further evaluation. Large left ovarian cyst measuring 7.7 cm. Nonemergent ultrasound may be helpful to clarify the simple versus complex nature of the cyst. Electronically Signed   By: Inez Catalina M.D.   On: 10/14/2019 00:44   US Abdomen Limited RUQ  Result Date: 10/14/2019 CLINICAL DATA:  Follow-up  abnormal gallbladder on recent CT examination. EXAM: ULTRASOUND ABDOMEN LIMITED RIGHT UPPER QUADRANT COMPARISON:  CT from earlier in the same day. FINDINGS: Gallbladder: Gallbladder is well distended. Gallbladder sludge and stones are noted similar to that seen on prior CT. Negative sonographic Murphy's sign is noted. No wall thickening is seen. No pericholecystic fluid is noted. Common bile duct: Diameter: 10 mm. This is consistent with that seen on recent CT and suggestive of a distal common bile duct stone. Liver: Diffusely increased in echogenicity consistent with fatty infiltration. No intrahepatic biliary ductal dilatation is seen. Portal vein is patent on color Doppler imaging with normal direction of blood flow towards the liver. Other: None. IMPRESSION: Cholelithiasis and gallbladder sludge without complicating factors. Dilated common bile duct suspicious for distal stone similar to that seen on the prior exam. Electronically Signed   By: Inez Catalina M.D.   On: 10/14/2019 03:48     All images have been reviewed by me personally.  EKG: Independently reviewed.   Assessment/Plan Active Problems:   Essential hypertension   Type 2 diabetes mellitus without complication, without long-term current use of insulin (HCC)   Abdominal pain   Common bile duct dilatation   Elevated LFTs   Obesity (BMI 30-39.9)   Ovarian cyst   AKI (acute kidney injury) ([place])   Assessment  plan  # RUQ abdominal pain with dilated CBD suspicious for distal stone #Elevated LFT  LFT was normal last night, and now mildly elevated with AST 86, ALT 55 and normal bilirubin -admit to obs medical floor -Follow-up CMP -N.p.o. -Pain control -MRCP is pending -[facility] was consulted last night - blood Cx is pending. No evidence of infection for now so hold antibiotics. .   # AKI  - Cr 1.26 on admission, likely due to pre-renal  - IVF and renal function in am -Hold home HCTZ and lisinopril   # HTN #Obesity BMI  34.06  -Chronic and at baseline -Weight loss per PCP  #Incidental finding of large left ovarian cyst measuring 7.7 cm noted on CT scan  -follow-up with PCP as outpatient  # T2DM  - HGB A1C Glucose AC and HS      There is no height or weight on file to calculate BMI.        DVT prophylaxis: SCD Code Status: Full code Family Communication: none at bedside Consults called: [facility] was notified by the ED provider on 7/7 Admission status: obs medical bed  Status is: Observation  The patient remains OBS appropriate and will d/c before 2 midnights.  Dispo: The patient is from: Home              Anticipated d/c is to: Home              Anticipated d/c date is: 2 days  Patient currently is not medically stable to d/c.       Time Spent: 65 minutes.  >50% of the time was devoted to discussing the patients care, assessment, plan and disposition with other care givers along with counseling the patient about the risks and benefits of treatment.    Charlann Lange MD [facility]  If 7PM-7AM, please contact night-coverage   10/14/2019, 2:29 PM

## 2019-10-14 NOTE — ED Notes (Signed)
Report called on pt. Pt will be transferred to the floor after she returns from MRI.

## 2019-10-14 NOTE — ED Notes (Signed)
Transport called to take pt to [facility]

## 2019-10-15 ENCOUNTER — Observation Stay (HOSPITAL_COMMUNITY): Payer: BC Managed Care – PPO

## 2019-10-15 DIAGNOSIS — Z6834 Body mass index (BMI) 34.0-34.9, adult: Secondary | ICD-10-CM | POA: Diagnosis not present

## 2019-10-15 DIAGNOSIS — K8020 Calculus of gallbladder without cholecystitis without obstruction: Secondary | ICD-10-CM

## 2019-10-15 DIAGNOSIS — K8050 Calculus of bile duct without cholangitis or cholecystitis without obstruction: Secondary | ICD-10-CM | POA: Diagnosis not present

## 2019-10-15 DIAGNOSIS — Z79899 Other long term (current) drug therapy: Secondary | ICD-10-CM | POA: Diagnosis not present

## 2019-10-15 DIAGNOSIS — K8062 Calculus of gallbladder and bile duct with acute cholecystitis without obstruction: Secondary | ICD-10-CM | POA: Diagnosis present

## 2019-10-15 DIAGNOSIS — E11649 Type 2 diabetes mellitus with hypoglycemia without coma: Secondary | ICD-10-CM | POA: Diagnosis present

## 2019-10-15 DIAGNOSIS — N179 Acute kidney failure, unspecified: Secondary | ICD-10-CM | POA: Diagnosis present

## 2019-10-15 DIAGNOSIS — R109 Unspecified abdominal pain: Secondary | ICD-10-CM | POA: Diagnosis present

## 2019-10-15 DIAGNOSIS — K810 Acute cholecystitis: Secondary | ICD-10-CM | POA: Diagnosis not present

## 2019-10-15 DIAGNOSIS — E860 Dehydration: Secondary | ICD-10-CM | POA: Diagnosis present

## 2019-10-15 DIAGNOSIS — Z7984 Long term (current) use of oral hypoglycemic drugs: Secondary | ICD-10-CM | POA: Diagnosis not present

## 2019-10-15 DIAGNOSIS — E669 Obesity, unspecified: Secondary | ICD-10-CM | POA: Diagnosis present

## 2019-10-15 DIAGNOSIS — Z20822 Contact with and (suspected) exposure to covid-19: Secondary | ICD-10-CM | POA: Diagnosis present

## 2019-10-15 DIAGNOSIS — R932 Abnormal findings on diagnostic imaging of liver and biliary tract: Secondary | ICD-10-CM | POA: Diagnosis not present

## 2019-10-15 DIAGNOSIS — I10 Essential (primary) hypertension: Secondary | ICD-10-CM | POA: Diagnosis present

## 2019-10-15 LAB — HEMOGLOBIN A1C
Hgb A1c MFr Bld: 6.4 % — ABNORMAL HIGH (ref 4.8–5.6)
Mean Plasma Glucose: 136.98 mg/dL

## 2019-10-15 LAB — COMPREHENSIVE METABOLIC PANEL
ALT: 40 U/L (ref 0–44)
AST: 38 U/L (ref 15–41)
Albumin: 3.4 g/dL — ABNORMAL LOW (ref 3.5–5.0)
Alkaline Phosphatase: 62 U/L (ref 38–126)
Anion gap: 9 (ref 5–15)
BUN: 11 mg/dL (ref 8–23)
CO2: 26 mmol/L (ref 22–32)
Calcium: 9.1 mg/dL (ref 8.9–10.3)
Chloride: 107 mmol/L (ref 98–111)
Creatinine, Ser: 1.11 mg/dL — ABNORMAL HIGH (ref 0.44–1.00)
GFR calc Af Amer: 59 mL/min — ABNORMAL LOW (ref >60)
GFR calc non Af Amer: 51 mL/min — ABNORMAL LOW (ref >60)
Glucose, Bld: 98 mg/dL (ref 70–99)
Potassium: 4.2 mmol/L (ref 3.5–5.1)
Sodium: 142 mmol/L (ref 135–145)
Total Bilirubin: 1 mg/dL (ref 0.3–1.2)
Total Protein: 6.7 g/dL (ref 6.5–8.1)

## 2019-10-15 LAB — CBC WITH DIFFERENTIAL/PLATELET
Abs Immature Granulocytes: 0.01 10*3/uL (ref 0.00–0.07)
Basophils Absolute: 0 10*3/uL (ref 0.0–0.1)
Basophils Relative: 0 %
Eosinophils Absolute: 0.1 10*3/uL (ref 0.0–0.5)
Eosinophils Relative: 1 %
HCT: 41.6 % (ref 36.0–46.0)
Hemoglobin: 12.9 g/dL (ref 12.0–15.0)
Immature Granulocytes: 0 %
Lymphocytes Relative: 17 %
Lymphs Abs: 1.3 10*3/uL (ref 0.7–4.0)
MCH: 27.9 pg (ref 26.0–34.0)
MCHC: 31 g/dL (ref 30.0–36.0)
MCV: 90 fL (ref 80.0–100.0)
Monocytes Absolute: 0.9 10*3/uL (ref 0.1–1.0)
Monocytes Relative: 11 %
Neutro Abs: 5.5 10*3/uL (ref 1.7–7.7)
Neutrophils Relative %: 71 %
Platelets: 254 10*3/uL (ref 150–400)
RBC: 4.62 MIL/uL (ref 3.87–5.11)
RDW: 14.6 % (ref 11.5–15.5)
WBC: 7.7 10*3/uL (ref 4.0–10.5)
nRBC: 0 % (ref 0.0–0.2)

## 2019-10-15 LAB — GLUCOSE, CAPILLARY
Glucose-Capillary: 94 mg/dL (ref 70–99)
Glucose-Capillary: 65 mg/dL — ABNORMAL LOW (ref 70–99)
Glucose-Capillary: 80 mg/dL (ref 70–99)
Glucose-Capillary: 95 mg/dL (ref 70–99)

## 2019-10-15 MED ORDER — MORPHINE SULFATE (PF) 2 MG/ML IV SOLN: 2.0000 mg | INTRAVENOUS | Status: DC | PRN

## 2019-10-15 MED ORDER — AMLODIPINE BESYLATE 5 MG PO TABS
5.0000 mg | ORAL_TABLET | Freq: Every day | ORAL | Status: DC
  Administered 2019-10-15 – 2019-10-17 (×3): 5 mg via ORAL
  Filled 2019-10-15 (×3): qty 1

## 2019-10-15 MED ORDER — MORPHINE SULFATE (PF) 2 MG/ML IV SOLN
2.0000 mg | INTRAVENOUS | Status: DC | PRN
  Administered 2019-10-15 – 2019-10-16 (×4): 2 mg via INTRAVENOUS
  Filled 2019-10-15 (×4): qty 1

## 2019-10-15 MED ORDER — KETOROLAC TROMETHAMINE 15 MG/ML IJ SOLN
15.0000 mg | Freq: Once | INTRAMUSCULAR | Status: AC
  Administered 2019-10-15: 15 mg via INTRAVENOUS
  Filled 2019-10-15: qty 1

## 2019-10-15 MED ORDER — TECHNETIUM TC 99M MEBROFENIN IV KIT
4.7900 | PACK | Freq: Once | INTRAVENOUS | Status: AC
  Administered 2019-10-15: 4.79 via INTRAVENOUS

## 2019-10-15 MED ORDER — DEXTROSE 50 % IV SOLN
INTRAVENOUS | Status: AC
  Administered 2019-10-15: 25 mL
  Filled 2019-10-15: qty 50

## 2019-10-15 MED ORDER — SODIUM CHLORIDE 0.9 % IV SOLN: INTRAVENOUS | Status: DC

## 2019-10-15 MED ORDER — HYDRALAZINE HCL 20 MG/ML IJ SOLN: 10.0000 mg | Freq: Four times a day (QID) | INTRAMUSCULAR | Status: DC | PRN

## 2019-10-15 MED ORDER — DEXTROSE-NACL 5-0.9 % IV SOLN: INTRAVENOUS | Status: AC

## 2019-10-15 MED ORDER — PROCHLORPERAZINE EDISYLATE 10 MG/2ML IJ SOLN: 10.0000 mg | Freq: Four times a day (QID) | INTRAMUSCULAR | Status: DC | PRN

## 2019-10-15 MED ORDER — PANTOPRAZOLE SODIUM 40 MG PO TBEC
40.0000 mg | DELAYED_RELEASE_TABLET | Freq: Every day | ORAL | Status: DC
  Administered 2019-10-15: 40 mg via ORAL
  Filled 2019-10-15: qty 1

## 2019-10-15 MED ORDER — MORPHINE SULFATE (PF) 4 MG/ML IV SOLN
3.0000 mg | Freq: Once | INTRAVENOUS | Status: AC
  Administered 2019-10-15: 3 mg via INTRAVENOUS
  Filled 2019-10-15: qty 1

## 2019-10-15 NOTE — Progress Notes (Signed)
Patients blood sugar 65 patient given half an ampule of D50. Nuclear medicine taking patient down for a HIDA scan notified Jeani Hawking to recheck blood glucose in 15 mins. MD notified.

## 2019-10-15 NOTE — Progress Notes (Signed)
[facility] Gastroenterology Progress Note  Ann Mcguire 69 y.o. Oct 05, 1950   Subjective: RUQ pain overnight. Feels ok now.  Objective: Vital signs: Vitals:   10/15/19 0546 10/15/19 0907  BP: (!) 153/78 (!) 141/82  Pulse: (!) 57 62  Resp: 18 14  Temp: 98.8 F (37.1 C) 98.1 F (36.7 C)  SpO2: 99% 98%    Physical Exam: Gen: lethargic, no acute distress  HEENT: anicteric sclera CV: RRR Chest: CTA B Abd: RUQ tenderness with guarding, soft, nondistended, +BS Ext: no edema  Lab Results: Recent Labs    10/14/19 1216 10/15/19 0459  NA 143 142  K 4.1 4.2  CL 106 107  CO2 24 26  GLUCOSE 96 98  BUN 14 11  CREATININE 1.26* 1.11*  CALCIUM 9.4 9.1   Recent Labs    10/14/19 1216 10/15/19 0459  AST 86* 38  ALT 55* 40  ALKPHOS 70 62  BILITOT 0.8 1.0  PROT 7.2 6.7  ALBUMIN 3.8 3.4*   Recent Labs    10/14/19 1216 10/15/19 0459  WBC 5.1 7.7  NEUTROABS 3.1 5.5  HGB 12.9 12.9  HCT 41.4 41.6  MCV 88.1 90.0  PLT 320 254      Assessment/Plan: Biliary colic - HIDA scan this morning to evaluate for acalculous cholecystitis. Inpt surgery consult needed but await HIDA scan findings. Supportive care. Will f/u.   Ann Mcguire 10/15/2019, 9:42 AM  Questions please call [phone] ID: Ann Mcguire, female   DOB: [birth year], 69 y.o.   MRN: [id number]

## 2019-10-15 NOTE — Progress Notes (Signed)
[facility] Surgery Consult Note  Ann Mcguire [birth year]  [id number].    Requesting MD: Irine Seal Chief Complaint:  Abdominal pain Reason for Consult: Symptomatic cholelithiasis and dilated common bile duct  HPI:  Patient is a 69 year old female who presented to the ED on 10/13/19 with abdominal pain. She had episodes on and off for years where she would get right upper quadrant epigastric pain while eating. Afterwards she would feel bloated and have nausea and vomiting. The symptoms would improve and recur. She had an ultrasound in 2016 which showed gallstones, but they have tried to manage symptoms with diet in the past.  She has an appointment through her primary care doctor for GI evaluation on 12/07/2019.   Work-up on 7/6 showed normal WBC, normal LFTs and lipase. CT showed dilatation of the gallbladder with mild central biliary ductal dilatation and common bile duct dilatation. She was admitted by medicine for right upper quadrant pain and possible choledocholithiasis., AKI with a creatinine of 1.26, hypertension, obesity, incidental finding of a 7.7 cm left ovarian cyst. She has been seen in consultation by GI and Dr. Wilford Corner.   Work-up so far shows she is afebrile vital signs are stable. Labs shows normal CMP. Creatinine 1.26>> 1.11. WBC 7.7 CT of the abdomen/pelvis 7/6 listed above. Abdominal ultrasound 7/7 shows cholelithiasis gallbladder sludge without complicating factors. CBD is 10 mm consistent with the the CT scan. Suggestive of a distal common bile duct stone. MRCP shows numerous gallstones in the gallbladder, mildly distended no gallbladder wall thickening pericholecystic fluid. Mild intrahepatic ductal dilatation and dilatation of the common bile duct up to 1.2 cm without filling defect visualized to the ampulla. Pancreatic duct is prominent measuring 3 mm in caliber again no obstructing identified lesion. HIDA scan is pending and we are asked to see and  evaluate for possible cholecystectomy.  ROS: Review of Systems  Constitutional: Negative.   HENT: Negative.   Eyes: Negative.   Respiratory: Negative.   Cardiovascular: Negative.   Gastrointestinal: Positive for abdominal pain, nausea and vomiting. Negative for blood in stool, constipation, diarrhea and melena.       Colonoscopy less than 10 years ago in [place]  Genitourinary: Negative.   Musculoskeletal: Negative.   Skin: Negative.   Neurological: Negative.   Endo/Heme/Allergies: Negative.   Psychiatric/Behavioral: Negative.     Family History  Problem Relation Age of Onset  . Hypertension Sister     Past Medical History:  Diagnosis Date  . Chicken pox   . Fainting spell   . Hypertension     History reviewed. No pertinent surgical history.  Social History:  reports that she has never smoked. She has never used smokeless tobacco. She reports that she does not drink alcohol and does not use drugs.  Allergies: No Known Allergies  Medications Prior to Admission  Medication Sig Dispense Refill  . dicyclomine (BENTYL) 10 MG capsule Take 1 capsule (10 mg total) by mouth 4 (four) times daily -  before meals and at bedtime. 21 capsule 0  . hydrochlorothiazide (HYDRODIURIL) 25 MG tablet TAKE 1 TABLET(25 MG) BY MOUTH DAILY (Patient taking differently: Take 25 mg by mouth daily. ) 90 tablet 0  . ibuprofen (ADVIL) 200 MG tablet Take 400 mg by mouth every 6 (six) hours as needed for moderate pain.    Marland Kitchen lisinopril (ZESTRIL) 20 MG tablet Take 1 tablet (20 mg total) by mouth daily. 90 tablet 0  . metFORMIN (GLUCOPHAGE-XR) 500 MG 24 hr tablet TAKE 1  TABLET(500 MG) BY MOUTH AT BEDTIME (Patient taking differently: Take 500 mg by mouth at bedtime. ) 90 tablet 0  . Multiple Vitamin (MULTIVITAMIN WITH MINERALS) TABS tablet Take 1 tablet by mouth daily.    Marland Kitchen omeprazole (PRILOSEC) 40 MG capsule Take 1 capsule (40 mg total) by mouth daily. 90 capsule 0    Blood pressure (!) 141/82, pulse 62,  temperature 98.1 F (36.7 C), temperature source Oral, resp. rate 14, SpO2 98 %. Physical Exam:  General: pleasant, WD, WN AA female who is laying in bed in NAD HEENT: head is normocephalic, atraumatic.  Sclera are noninjected.  Pupils are equal.  Ears and nose without any masses or lesions.  Mouth is pink and moist Heart: regular, rate, and rhythm.  Normal s1,s2. No obvious murmurs, gallops, or rubs noted.  Palpable radial and pedal pulses bilaterally Lungs: CTAB, no wheezes, rhonchi, or rales noted.  Respiratory effort nonlabored Abd: soft, minimal tenderness RUQ currently, after pain medicine, ND, +BS, no masses, hernias, or organomegaly MS: all 4 extremities are symmetrical with no cyanosis, clubbing, or edema. Skin: warm and dry with no masses, lesions, or rashes Neuro: Cranial nerves 2-12 grossly intact, sensation is normal throughout Psych: A&Ox3 with an appropriate affect.   Results for orders placed or performed during the hospital encounter of 10/14/19 (from the past 48 hour(s))  Comprehensive metabolic panel     Status: Abnormal   Collection Time: 10/14/19 12:16 PM  Result Value Ref Range   Sodium 143 135 - 145 mmol/L   Potassium 4.1 3.5 - 5.1 mmol/L   Chloride 106 98 - 111 mmol/L   CO2 24 22 - 32 mmol/L   Glucose, Bld 96 70 - 99 mg/dL    Comment: Glucose reference range applies only to samples taken after fasting for at least 8 hours.   BUN 14 8 - 23 mg/dL   Creatinine, Ser 1.26 (H) 0.44 - 1.00 mg/dL   Calcium 9.4 8.9 - 10.3 mg/dL   Total Protein 7.2 6.5 - 8.1 g/dL   Albumin 3.8 3.5 - 5.0 g/dL   AST 86 (H) 15 - 41 U/L   ALT 55 (H) 0 - 44 U/L   Alkaline Phosphatase 70 38 - 126 U/L   Total Bilirubin 0.8 0.3 - 1.2 mg/dL   GFR calc non Af Amer 43 (L) >60 mL/min   GFR calc Af Amer 50 (L) >60 mL/min   Anion gap 13 5 - 15    Comment: Performed at [facility], [place] [address] [place], [place] [address]  CBC with Differential     Status: None   Collection  Time: 10/14/19 12:16 PM  Result Value Ref Range   WBC 5.1 4.0 - 10.5 K/uL   RBC 4.70 3.87 - 5.11 MIL/uL   Hemoglobin 12.9 12.0 - 15.0 g/dL   HCT 41.4 36 - 46 %   MCV 88.1 80.0 - 100.0 fL   MCH 27.4 26.0 - 34.0 pg   MCHC 31.2 30.0 - 36.0 g/dL   RDW 14.6 11.5 - 15.5 %   Platelets 320 150 - 400 K/uL   nRBC 0.0 0.0 - 0.2 %   Neutrophils Relative % 61 %   Neutro Abs 3.1 1.7 - 7.7 K/uL   Lymphocytes Relative 27 %   Lymphs Abs 1.4 0.7 - 4.0 K/uL   Monocytes Relative 10 %   Monocytes Absolute 0.5 0 - 1 K/uL   Eosinophils Relative 1 %   Eosinophils Absolute 0.0 0 -  0 K/uL   Basophils Relative 1 %   Basophils Absolute 0.0 0 - 0 K/uL   Immature Granulocytes 0 %   Abs Immature Granulocytes 0.01 0.00 - 0.07 K/uL    Comment: Performed at [facility], [place] [address] [place], [place] [address]  Lipase, blood     Status: None   Collection Time: 10/14/19 12:16 PM  Result Value Ref Range   Lipase 36 11 - 51 U/L    Comment: Performed at [facility], [place] [address], [place], [place] [address]  TSH     Status: None   Collection Time: 10/14/19 12:16 PM  Result Value Ref Range   TSH 1.985 0.350 - 4.500 uIU/mL    Comment: Performed by a 3rd Generation assay with a functional sensitivity of <=0.01 uIU/mL. Performed at [facility], [place] [address] [place], [place] [address]   Glucose, capillary     Status: Abnormal   Collection Time: 10/14/19  3:59 PM  Result Value Ref Range   Glucose-Capillary 67 (L) 70 - 99 mg/dL    Comment: Glucose reference range applies only to samples taken after fasting for at least 8 hours.  Glucose, capillary     Status: Abnormal   Collection Time: 10/14/19  4:20 PM  Result Value Ref Range   Glucose-Capillary 117 (H) 70 - 99 mg/dL    Comment: Glucose reference range applies only to samples taken after fasting for at least 8 hours.  Glucose, capillary     Status: Abnormal   Collection Time: 10/14/19  9:36 PM   Result Value Ref Range   Glucose-Capillary 101 (H) 70 - 99 mg/dL    Comment: Glucose reference range applies only to samples taken after fasting for at least 8 hours.  Comprehensive metabolic panel     Status: Abnormal   Collection Time: 10/15/19  4:59 AM  Result Value Ref Range   Sodium 142 135 - 145 mmol/L   Potassium 4.2 3.5 - 5.1 mmol/L   Chloride 107 98 - 111 mmol/L   CO2 26 22 - 32 mmol/L   Glucose, Bld 98 70 - 99 mg/dL    Comment: Glucose reference range applies only to samples taken after fasting for at least 8 hours.   BUN 11 8 - 23 mg/dL   Creatinine, Ser 1.11 (H) 0.44 - 1.00 mg/dL   Calcium 9.1 8.9 - 10.3 mg/dL   Total Protein 6.7 6.5 - 8.1 g/dL   Albumin 3.4 (L) 3.5 - 5.0 g/dL   AST 38 15 - 41 U/L   ALT 40 0 - 44 U/L   Alkaline Phosphatase 62 38 - 126 U/L   Total Bilirubin 1.0 0.3 - 1.2 mg/dL   GFR calc non Af Amer 51 (L) >60 mL/min   GFR calc Af Amer 59 (L) >60 mL/min   Anion gap 9 5 - 15    Comment: Performed at [facility], [place] [address], [place], [place] [address]  CBC with Differential/Platelet     Status: None   Collection Time: 10/15/19  4:59 AM  Result Value Ref Range   WBC 7.7 4.0 - 10.5 K/uL   RBC 4.62 3.87 - 5.11 MIL/uL   Hemoglobin 12.9 12.0 - 15.0 g/dL   HCT 41.6 36 - 46 %   MCV 90.0 80.0 - 100.0 fL   MCH 27.9 26.0 - 34.0 pg   MCHC 31.0 30.0 - 36.0 g/dL   RDW 14.6 11.5 - 15.5 %  Platelets 254 150 - 400 K/uL   nRBC 0.0 0.0 - 0.2 %   Neutrophils Relative % 71 %   Neutro Abs 5.5 1.7 - 7.7 K/uL   Lymphocytes Relative 17 %   Lymphs Abs 1.3 0.7 - 4.0 K/uL   Monocytes Relative 11 %   Monocytes Absolute 0.9 0 - 1 K/uL   Eosinophils Relative 1 %   Eosinophils Absolute 0.1 0 - 0 K/uL   Basophils Relative 0 %   Basophils Absolute 0.0 0 - 0 K/uL   Immature Granulocytes 0 %   Abs Immature Granulocytes 0.01 0.00 - 0.07 K/uL    Comment: Performed at [facility], [place] [address] [place], [place] [address]   Hemoglobin A1c     Status: Abnormal   Collection Time: 10/15/19  4:59 AM  Result Value Ref Range   Hgb A1c MFr Bld 6.4 (H) 4.8 - 5.6 %    Comment: (NOTE) Pre diabetes:          5.7%-6.4%  Diabetes:              >6.4%  Glycemic control for   <7.0% adults with diabetes    Mean Plasma Glucose 136.98 mg/dL    Comment: Performed at [place] [address], [place], [place] [address]  Glucose, capillary     Status: None   Collection Time: 10/15/19  7:12 AM  Result Value Ref Range   Glucose-Capillary 94 70 - 99 mg/dL    Comment: Glucose reference range applies only to samples taken after fasting for at least 8 hours.   CT ABDOMEN PELVIS W CONTRAST  Result Date: 10/14/2019 CLINICAL DATA:  Right upper quadrant pain for several months EXAM: CT ABDOMEN AND PELVIS WITH CONTRAST TECHNIQUE: Multidetector CT imaging of the abdomen and pelvis was performed using the standard protocol following bolus administration of intravenous contrast. CONTRAST:  169mL OMNIPAQUE IOHEXOL 300 MG/ML  SOLN COMPARISON:  None. FINDINGS: Lower chest: Minimal right basilar atelectasis is noted. Hepatobiliary: Gallbladder is well distended with dependent density likely related to a combination of small gallstones and gallbladder sludge. No definitive wall thickening is seen. Mild central intrahepatic ductal dilatation is noted. The liver is otherwise within normal limits. Common bile duct is prominent measuring up to 8 mm. This is suspicious for an underlying distal common bile duct stone although no definitive calcification is seen. Pancreas: Pancreas shows no focal mass. Very mild central dilatation of the pancreatic duct is noted which may be related to the distal common bile duct dilatation. Again this is suspicious for small distal stone. Spleen: Normal in size without focal abnormality. Adrenals/Urinary Tract: Adrenal glands are within normal limits. Kidneys are within normal limits. No renal calculi are noted. No  obstructive changes are seen. The bladder is partially distended. Stomach/Bowel: The appendix is within normal limits. No obstructive or inflammatory changes of the large or small bowel are seen. Stomach is within normal limits. Vascular/Lymphatic: No significant vascular findings are present. No enlarged abdominal or pelvic lymph nodes. Reproductive: Uterus is within normal limits. Large left ovarian cyst is noted which appears simple in nature. This measures 7.7 cm in dimension. Other: No abdominal wall hernia or abnormality. No abdominopelvic ascites. Musculoskeletal: Degenerative changes of lumbar spine are noted. IMPRESSION: Dilation of the gallbladder with mild central biliary ductal dilatation and common bile duct dilatation. Changes of gallbladder sludge and small stones are noted. These changes suggest a distal common bile duct stone. Ultrasound may be helpful  for further evaluation. Large left ovarian cyst measuring 7.7 cm. Nonemergent ultrasound may be helpful to clarify the simple versus complex nature of the cyst. Electronically Signed   By: Inez Catalina M.D.   On: 10/14/2019 00:44   MR 3D Recon At Scanner  Result Date: 10/14/2019 CLINICAL DATA:  Nausea, right upper quadrant pain since Saturday, cholelithiasis EXAM: MRI ABDOMEN WITHOUT AND WITH CONTRAST (INCLUDING MRCP) TECHNIQUE: Multiplanar multisequence MR imaging of the abdomen was performed both before and after the administration of intravenous contrast. Heavily T2-weighted images of the biliary and pancreatic ducts were obtained, and three-dimensional MRCP images were rendered by post processing. CONTRAST:  21mL GADAVIST GADOBUTROL 1 MMOL/ML IV SOLN COMPARISON:  CT abdomen pelvis, 10/14/2019, abdominal ultrasound, 10/14/2019 FINDINGS: Lower chest: No acute findings. Hepatobiliary: No mass or other parenchymal abnormality identified. Numerous gallstones in the gallbladder, which is mildly distended. No gallbladder wall thickening or  pericholecystic fluid. There is mild intrahepatic biliary ductal dilatation and dilatation of the common bile duct up to 1.2 cm without filling defect visualized to the ampulla. Pancreas: No mass, inflammatory changes, or other parenchymal abnormality identified. The pancreatic duct is mildly prominent, measuring up to 3 mm in caliber. Spleen:  Within normal limits in size and appearance. Adrenals/Urinary Tract: No masses identified. No evidence of hydronephrosis. Stomach/Bowel: Visualized portions within the abdomen are unremarkable. Vascular/Lymphatic: No pathologically enlarged lymph nodes identified. No abdominal aortic aneurysm demonstrated. Other:  None. Musculoskeletal: No suspicious bone lesions identified. IMPRESSION: 1. Numerous gallstones in the gallbladder, which is mildly distended. No gallbladder wall thickening or pericholecystic fluid. 2. Mild intrahepatic biliary ductal dilatation and dilatation of the common bile duct up to 1.2 cm without filling defect visualized to the ampulla. 3. The pancreatic duct is mildly prominent, measuring up to 3 mm in caliber. Again, no obstructing lesion identified. 4. Patency of the cystic and common bile ducts may be evaluated by HIDA or ERCP if desired. Electronically Signed   By: Eddie Candle M.D.   On: 10/14/2019 15:37   MR ABDOMEN MRCP W WO CONTAST  Result Date: 10/14/2019 CLINICAL DATA:  Nausea, right upper quadrant pain since Saturday, cholelithiasis EXAM: MRI ABDOMEN WITHOUT AND WITH CONTRAST (INCLUDING MRCP) TECHNIQUE: Multiplanar multisequence MR imaging of the abdomen was performed both before and after the administration of intravenous contrast. Heavily T2-weighted images of the biliary and pancreatic ducts were obtained, and three-dimensional MRCP images were rendered by post processing. CONTRAST:  7mL GADAVIST GADOBUTROL 1 MMOL/ML IV SOLN COMPARISON:  CT abdomen pelvis, 10/14/2019, abdominal ultrasound, 10/14/2019 FINDINGS: Lower chest: No acute  findings. Hepatobiliary: No mass or other parenchymal abnormality identified. Numerous gallstones in the gallbladder, which is mildly distended. No gallbladder wall thickening or pericholecystic fluid. There is mild intrahepatic biliary ductal dilatation and dilatation of the common bile duct up to 1.2 cm without filling defect visualized to the ampulla. Pancreas: No mass, inflammatory changes, or other parenchymal abnormality identified. The pancreatic duct is mildly prominent, measuring up to 3 mm in caliber. Spleen:  Within normal limits in size and appearance. Adrenals/Urinary Tract: No masses identified. No evidence of hydronephrosis. Stomach/Bowel: Visualized portions within the abdomen are unremarkable. Vascular/Lymphatic: No pathologically enlarged lymph nodes identified. No abdominal aortic aneurysm demonstrated. Other:  None. Musculoskeletal: No suspicious bone lesions identified. IMPRESSION: 1. Numerous gallstones in the gallbladder, which is mildly distended. No gallbladder wall thickening or pericholecystic fluid. 2. Mild intrahepatic biliary ductal dilatation and dilatation of the common bile duct up to 1.2 cm without filling defect visualized  to the ampulla. 3. The pancreatic duct is mildly prominent, measuring up to 3 mm in caliber. Again, no obstructing lesion identified. 4. Patency of the cystic and common bile ducts may be evaluated by HIDA or ERCP if desired. Electronically Signed   By: Eddie Candle M.D.   On: 10/14/2019 15:37   US Abdomen Limited RUQ  Result Date: 10/14/2019 CLINICAL DATA:  Follow-up abnormal gallbladder on recent CT examination. EXAM: ULTRASOUND ABDOMEN LIMITED RIGHT UPPER QUADRANT COMPARISON:  CT from earlier in the same day. FINDINGS: Gallbladder: Gallbladder is well distended. Gallbladder sludge and stones are noted similar to that seen on prior CT. Negative sonographic Murphy's sign is noted. No wall thickening is seen. No pericholecystic fluid is noted. Common bile  duct: Diameter: 10 mm. This is consistent with that seen on recent CT and suggestive of a distal common bile duct stone. Liver: Diffusely increased in echogenicity consistent with fatty infiltration. No intrahepatic biliary ductal dilatation is seen. Portal vein is patent on color Doppler imaging with normal direction of blood flow towards the liver. Other: None. IMPRESSION: Cholelithiasis and gallbladder sludge without complicating factors. Dilated common bile duct suspicious for distal stone similar to that seen on the prior exam. Electronically Signed   By: Inez Catalina M.D.   On: 10/14/2019 03:48  acetaminophen **OR** acetaminophen, fentaNYL (SUBLIMAZE) injection, ondansetron **OR** ondansetron (ZOFRAN) IV, traMADol  . dextrose 5 % and 0.9% NaCl 75 mL/hr at 10/15/19 0950   Anti-infectives (From admission, onward)   None      Assessment/Plan Hx type 2 diabetes Hypertension BMI 34.6 AKI/dehydration  Creatinine 1.26>>1.11  Abdominal pain Symptomatic cholelithiasis Biliary colic  FEN:  IV fluids/NPO [id number] DVT:  SCD Follow up:  TBD  Plan:  Await HIDA and will follow with you.    Earnstine Regal [facility] Surgery 10/15/2019, 9:37 AM Please see Amion for pager number during day hours 7:00am-4:30pm

## 2019-10-15 NOTE — Progress Notes (Signed)
PROGRESS NOTE    Ann Mcguire  OAC:[id number] DOB: [birth year] DOA: 10/14/2019 PCP: Libby Maw, MD    Chief Complaint  Patient presents with  . Abdominal Pain    to be admitted    Brief Narrative:  Patient is a pleasant 69 year old female history of hypertension, obesity, type 2 diabetes presenting right upper quadrant pain which has progressively worsened over the past 4 days.  Pain does wax and wane with associated symptoms of nausea but no vomiting or diarrhea.  Right upper quadrant pain associated with food intake.  CT of the abdomen and pelvis done showed dilatation of the gallbladder with mild central biliary duct dilatation and common bile duct dilatation with changes of gallbladder sludge and small stones noted.  Large left ovarian cyst of 7.7 cm noted.  Right upper quadrant ultrasound done consistent with cholelithiasis and gallbladder sludge and negative for Murphy sign.  GI consulted who recommended pain control and MRCP.  MRCP done with numerous gallstones in the gallbladder which was mildly distended, no gallbladder wall thickening or pericholecystic fluid, mild intrahepatic biliary ductal dilatation and dilatation of the CBD to 1.2 cm without filling defect visualized to the ampulla, pancreatic duct mildly prominent measuring 3 mm in caliber with no obstructing lesion identified.  HIDA scan was done which is currently pending.  GI recommended general surgery evaluation for symptomatic cholelithiasis/biliary colic for evaluation for lap cholecystectomy.   Assessment & Plan:   Principal Problem:   Symptomatic cholelithiasis Active Problems:   Biliary colic   Essential hypertension   Type 2 diabetes mellitus without complication, without long-term current use of insulin (HCC)   Abdominal pain   Common bile duct dilatation   Elevated LFTs   Obesity (BMI 30-39.9)   Ovarian cyst   AKI (acute kidney injury) ([place])  1 Symptomatic cholelithiasis/biliary  colic Patient presented with worsening right upper quadrant abdominal pain x4 days, nausea, pain aggravated by food.  CT abdomen and pelvis done dilation of the gallbladder with mild central biliary ductal dilatation and common bile duct dilatation.  Changes of gallbladder sludge and small stones noted.  Changes suggest distal common bile duct stone.  Large left ovarian cyst measuring 7.7 cm.  Right upper quadrant ultrasound was done consistent with cholelithiasis and gallbladder sludge without complicating factors.  Dilated common bile duct suspicious for distal stone similar to that seen on prior exam.  MRCP done which showed numerous gallstones in the gallbladder which was mildly distended, no gallbladder wall thickening or pericholecystic fluid.  Mild intrahepatic biliary ductal dilatation and dilatation of the common bile duct up to 1.2 cm without filling defect visualized to the ampulla.  Pancreatic duct mildly prominent measuring up to 3 mm in caliber with no obstructing lesion identified.  HIDA scan ordered.  Patient seen in consultation by GI who feel patient does not need a ERCP at this time.  GI recommended evaluation by general surgery for lap cholecystectomy.  Patient seen in consultation by general surgery and patient for probable lap cholecystectomy in 1 to 2 days.  Patient just returning from HIDA scan will place on a soft low-fat diet for now.  N.p.o. at 2 AM 10/16/2019 in anticipation of possible lap cholecystectomy tomorrow 10/16/2019.  IV fluids.  Pain management.  GI and general surgery following and appreciate input and recommendations.  2.  Acute kidney injury Likely secondary to a prerenal azotemia in the setting of HCTZ and lisinopril.  Renal function improving with hydration.  Change IV fluids to  D5 normal saline due to low blood glucose levels at 100 cc/h.  Follow.  3.  Hypertension HCTZ and lisinopril on hold secondary to problem #2.  Start Norvasc 5 mg daily.  Hydralazine as needed.   Follow.  4.  Obesity  5.  Incidental finding of large left ovarian cyst measuring 7.7 cm Noted on CT scan.  Outpatient follow-up with PCP for ultrasound and further evaluation.  6.  Well-controlled type 2 diabetes mellitus Hemoglobin A1c of 6.4.  Patient noted to have a CBG of 65 this morning.  Patient was n.p.o.  Placed on D5 normal saline.  Continue to hold oral hypoglycemic agents.  Follow.  7.   DVT prophylaxis: SCDs Code Status: Full Family Communication: Updated patient.  No family at bedside. Disposition:   Status is: Observation    Dispo: The patient is from: Home              Anticipated d/c is to: Home              Anticipated d/c date is: 2 to 3 days.              Patient currently with symptomatic cholelithiasis/biliary colic currently being worked up.  GI and general surgery consulted and patient for probable lap cholecystectomy during this hospitalization.  Not stable for discharge.       Consultants:   Gastroenterology: Dr. Michail Sermon 10/14/2019  General surgery: Dr. Barry Dienes 10/15/2019  Procedures:   CT abdomen and pelvis 10/14/2019  Right upper quadrant ultrasound 10/14/2019  HIDA scan 10/15/2019  MRCP 10/14/2019  Antimicrobials:   None   Subjective: Just returning from a HIDA scan.  Laying in bed.  Denies any chest pain.  No shortness of breath.  Denies any nausea vomiting.  Improvement with right upper quadrant pain per patient.  Asking for a diet.  Objective: Vitals:   10/15/19 0140 10/15/19 0546 10/15/19 0907 10/15/19 1457  BP: (!) 151/76 (!) 153/78 (!) 141/82 (!) 160/65  Pulse: 60 (!) 57 62 67  Resp: 18 18 14 18   Temp: 99 F (37.2 C) 98.8 F (37.1 C) 98.1 F (36.7 C) 98.9 F (37.2 C)  TempSrc: Oral Oral Oral Oral  SpO2: 97% 99% 98% 94%    Intake/Output Summary (Last 24 hours) at 10/15/2019 1619 Last data filed at 10/15/2019 1600 Gross per 24 hour  Intake 1390.56 ml  Output 1750 ml  Net -359.44 ml   There were no vitals filed for this  visit.  Examination:  General exam: Appears calm and comfortable  Respiratory system: Clear to auscultation. Respiratory effort normal. Cardiovascular system: S1 & S2 heard, RRR. No JVD, murmurs, rubs, gallops or clicks. No pedal edema. Gastrointestinal system: Abdomen is nondistended, soft and some tenderness to palpation right upper quadrant.  Positive bowel sounds.  No rebound.  No guarding.   Central nervous system: Alert and oriented. No focal neurological deficits. Extremities: Symmetric 5 x 5 power. Skin: No rashes, lesions or ulcers Psychiatry: Judgement and insight appear normal. Mood & affect appropriate.     Data Reviewed: I have personally reviewed following labs and imaging studies  CBC: Recent Labs  Lab 10/13/19 2135 10/14/19 1216 10/15/19 0459  WBC 5.6 5.1 7.7  NEUTROABS  --  3.1 5.5  HGB 14.0 12.9 12.9  HCT 44.0 41.4 41.6  MCV 87.1 88.1 90.0  PLT 329 320 119    Basic Metabolic Panel: Recent Labs  Lab 10/13/19 2135 10/14/19 1216 10/15/19 0459  NA 141 143 142  K 3.9 4.1 4.2  CL 105 106 107  CO2 25 24 26   GLUCOSE 106* 96 98  BUN 15 14 11   CREATININE 1.20* 1.26* 1.11*  CALCIUM 9.2 9.4 9.1    GFR: Estimated Creatinine Clearance: 55.8 mL/min (A) (by C-G formula based on SCr of 1.11 mg/dL (H)).  Liver Function Tests: Recent Labs  Lab 10/13/19 2135 10/14/19 1216 10/15/19 0459  AST 21 86* 38  ALT 17 55* 40  ALKPHOS 61 70 62  BILITOT 1.0 0.8 1.0  PROT 7.8 7.2 6.7  ALBUMIN 4.1 3.8 3.4*    CBG: Recent Labs  Lab 10/14/19 1620 10/14/19 2136 10/15/19 0712 10/15/19 1123 10/15/19 1502  GLUCAP 117* 101* 94 65* 80     Recent Results (from the past 240 hour(s))  SARS Coronavirus 2 by RT PCR (hospital order, performed in [facility] hospital lab) Nasopharyngeal Nasopharyngeal Swab     Status: None   Collection Time: 10/14/19  1:54 AM   Specimen: Nasopharyngeal Swab  Result Value Ref Range Status   SARS Coronavirus 2 NEGATIVE NEGATIVE Final      Comment: (NOTE) SARS-CoV-2 target nucleic acids are NOT DETECTED.  The SARS-CoV-2 RNA is generally detectable in upper and lower respiratory specimens during the acute phase of infection. The lowest concentration of SARS-CoV-2 viral copies this assay can detect is 250 copies / mL. A negative result does not preclude SARS-CoV-2 infection and should not be used as the sole basis for treatment or other patient management decisions.  A negative result may occur with improper specimen collection / handling, submission of specimen other than nasopharyngeal swab, presence of viral mutation(s) within the areas targeted by this assay, and inadequate number of viral copies (<250 copies / mL). A negative result must be combined with clinical observations, patient history, and epidemiological information.  Fact Sheet for Patients:   StrictlyIdeas.no  Fact Sheet for Healthcare Providers: BankingDealers.co.za  This test is not yet approved or  cleared by the [place] FDA and has been authorized for detection and/or diagnosis of SARS-CoV-2 by FDA under an Emergency Use Authorization (EUA).  This EUA will remain in effect (meaning this test can be used) for the duration of the COVID-19 declaration under Section 564(b)(1) of the Act, 21 U.S.C. section 360bbb-3(b)(1), unless the authorization is terminated or revoked sooner.  Performed at [facility], [address] [place], [place] [address]          Radiology Studies: CT ABDOMEN PELVIS W CONTRAST  Result Date: 10/14/2019 CLINICAL DATA:  Right upper quadrant pain for several months EXAM: CT ABDOMEN AND PELVIS WITH CONTRAST TECHNIQUE: Multidetector CT imaging of the abdomen and pelvis was performed using the standard protocol following bolus administration of intravenous contrast. CONTRAST:  143mL OMNIPAQUE IOHEXOL 300 MG/ML  SOLN COMPARISON:  None. FINDINGS: Lower chest:  Minimal right basilar atelectasis is noted. Hepatobiliary: Gallbladder is well distended with dependent density likely related to a combination of small gallstones and gallbladder sludge. No definitive wall thickening is seen. Mild central intrahepatic ductal dilatation is noted. The liver is otherwise within normal limits. Common bile duct is prominent measuring up to 8 mm. This is suspicious for an underlying distal common bile duct stone although no definitive calcification is seen. Pancreas: Pancreas shows no focal mass. Very mild central dilatation of the pancreatic duct is noted which may be related to the distal common bile duct dilatation. Again this is suspicious for small distal stone. Spleen: Normal in size without focal  abnormality. Adrenals/Urinary Tract: Adrenal glands are within normal limits. Kidneys are within normal limits. No renal calculi are noted. No obstructive changes are seen. The bladder is partially distended. Stomach/Bowel: The appendix is within normal limits. No obstructive or inflammatory changes of the large or small bowel are seen. Stomach is within normal limits. Vascular/Lymphatic: No significant vascular findings are present. No enlarged abdominal or pelvic lymph nodes. Reproductive: Uterus is within normal limits. Large left ovarian cyst is noted which appears simple in nature. This measures 7.7 cm in dimension. Other: No abdominal wall hernia or abnormality. No abdominopelvic ascites. Musculoskeletal: Degenerative changes of lumbar spine are noted. IMPRESSION: Dilation of the gallbladder with mild central biliary ductal dilatation and common bile duct dilatation. Changes of gallbladder sludge and small stones are noted. These changes suggest a distal common bile duct stone. Ultrasound may be helpful for further evaluation. Large left ovarian cyst measuring 7.7 cm. Nonemergent ultrasound may be helpful to clarify the simple versus complex nature of the cyst. Electronically Signed    By: Inez Catalina M.D.   On: 10/14/2019 00:44   MR 3D Recon At Scanner  Result Date: 10/14/2019 CLINICAL DATA:  Nausea, right upper quadrant pain since Saturday, cholelithiasis EXAM: MRI ABDOMEN WITHOUT AND WITH CONTRAST (INCLUDING MRCP) TECHNIQUE: Multiplanar multisequence MR imaging of the abdomen was performed both before and after the administration of intravenous contrast. Heavily T2-weighted images of the biliary and pancreatic ducts were obtained, and three-dimensional MRCP images were rendered by post processing. CONTRAST:  16mL GADAVIST GADOBUTROL 1 MMOL/ML IV SOLN COMPARISON:  CT abdomen pelvis, 10/14/2019, abdominal ultrasound, 10/14/2019 FINDINGS: Lower chest: No acute findings. Hepatobiliary: No mass or other parenchymal abnormality identified. Numerous gallstones in the gallbladder, which is mildly distended. No gallbladder wall thickening or pericholecystic fluid. There is mild intrahepatic biliary ductal dilatation and dilatation of the common bile duct up to 1.2 cm without filling defect visualized to the ampulla. Pancreas: No mass, inflammatory changes, or other parenchymal abnormality identified. The pancreatic duct is mildly prominent, measuring up to 3 mm in caliber. Spleen:  Within normal limits in size and appearance. Adrenals/Urinary Tract: No masses identified. No evidence of hydronephrosis. Stomach/Bowel: Visualized portions within the abdomen are unremarkable. Vascular/Lymphatic: No pathologically enlarged lymph nodes identified. No abdominal aortic aneurysm demonstrated. Other:  None. Musculoskeletal: No suspicious bone lesions identified. IMPRESSION: 1. Numerous gallstones in the gallbladder, which is mildly distended. No gallbladder wall thickening or pericholecystic fluid. 2. Mild intrahepatic biliary ductal dilatation and dilatation of the common bile duct up to 1.2 cm without filling defect visualized to the ampulla. 3. The pancreatic duct is mildly prominent, measuring up to 3  mm in caliber. Again, no obstructing lesion identified. 4. Patency of the cystic and common bile ducts may be evaluated by HIDA or ERCP if desired. Electronically Signed   By: Eddie Candle M.D.   On: 10/14/2019 15:37   MR ABDOMEN MRCP W WO CONTAST  Result Date: 10/14/2019 CLINICAL DATA:  Nausea, right upper quadrant pain since Saturday, cholelithiasis EXAM: MRI ABDOMEN WITHOUT AND WITH CONTRAST (INCLUDING MRCP) TECHNIQUE: Multiplanar multisequence MR imaging of the abdomen was performed both before and after the administration of intravenous contrast. Heavily T2-weighted images of the biliary and pancreatic ducts were obtained, and three-dimensional MRCP images were rendered by post processing. CONTRAST:  41mL GADAVIST GADOBUTROL 1 MMOL/ML IV SOLN COMPARISON:  CT abdomen pelvis, 10/14/2019, abdominal ultrasound, 10/14/2019 FINDINGS: Lower chest: No acute findings. Hepatobiliary: No mass or other parenchymal abnormality identified. Numerous gallstones in  the gallbladder, which is mildly distended. No gallbladder wall thickening or pericholecystic fluid. There is mild intrahepatic biliary ductal dilatation and dilatation of the common bile duct up to 1.2 cm without filling defect visualized to the ampulla. Pancreas: No mass, inflammatory changes, or other parenchymal abnormality identified. The pancreatic duct is mildly prominent, measuring up to 3 mm in caliber. Spleen:  Within normal limits in size and appearance. Adrenals/Urinary Tract: No masses identified. No evidence of hydronephrosis. Stomach/Bowel: Visualized portions within the abdomen are unremarkable. Vascular/Lymphatic: No pathologically enlarged lymph nodes identified. No abdominal aortic aneurysm demonstrated. Other:  None. Musculoskeletal: No suspicious bone lesions identified. IMPRESSION: 1. Numerous gallstones in the gallbladder, which is mildly distended. No gallbladder wall thickening or pericholecystic fluid. 2. Mild intrahepatic biliary ductal  dilatation and dilatation of the common bile duct up to 1.2 cm without filling defect visualized to the ampulla. 3. The pancreatic duct is mildly prominent, measuring up to 3 mm in caliber. Again, no obstructing lesion identified. 4. Patency of the cystic and common bile ducts may be evaluated by HIDA or ERCP if desired. Electronically Signed   By: Eddie Candle M.D.   On: 10/14/2019 15:37   US Abdomen Limited RUQ  Result Date: 10/14/2019 CLINICAL DATA:  Follow-up abnormal gallbladder on recent CT examination. EXAM: ULTRASOUND ABDOMEN LIMITED RIGHT UPPER QUADRANT COMPARISON:  CT from earlier in the same day. FINDINGS: Gallbladder: Gallbladder is well distended. Gallbladder sludge and stones are noted similar to that seen on prior CT. Negative sonographic Murphy's sign is noted. No wall thickening is seen. No pericholecystic fluid is noted. Common bile duct: Diameter: 10 mm. This is consistent with that seen on recent CT and suggestive of a distal common bile duct stone. Liver: Diffusely increased in echogenicity consistent with fatty infiltration. No intrahepatic biliary ductal dilatation is seen. Portal vein is patent on color Doppler imaging with normal direction of blood flow towards the liver. Other: None. IMPRESSION: Cholelithiasis and gallbladder sludge without complicating factors. Dilated common bile duct suspicious for distal stone similar to that seen on the prior exam. Electronically Signed   By: Inez Catalina M.D.   On: 10/14/2019 03:48        Scheduled Meds: . dicyclomine  10 mg Oral TID AC & HS  . pantoprazole  40 mg Oral Q0600   Continuous Infusions: . dextrose 5 % and 0.9% NaCl 75 mL/hr at 10/15/19 0950     LOS: 0 days    Time spent: 35 minutes    Irine Seal, MD [facility]   To contact the attending provider between 7A-7P or the covering provider during after hours 7P-7A, please log into the web site [email or web address] and access using universal [place] password  for that web site. If you do not have the password, please call the hospital operator.  10/15/2019, 4:19 PM

## 2019-10-16 LAB — HEPATIC FUNCTION PANEL
ALT: 26 U/L (ref 0–44)
AST: 21 U/L (ref 15–41)
Albumin: 3.1 g/dL — ABNORMAL LOW (ref 3.5–5.0)
Alkaline Phosphatase: 53 U/L (ref 38–126)
Bilirubin, Direct: 0.2 mg/dL (ref 0.0–0.2)
Indirect Bilirubin: 0.7 mg/dL (ref 0.3–0.9)
Total Bilirubin: 0.9 mg/dL (ref 0.3–1.2)
Total Protein: 6.2 g/dL — ABNORMAL LOW (ref 6.5–8.1)

## 2019-10-16 LAB — MRSA PCR SCREENING: MRSA by PCR: NEGATIVE

## 2019-10-16 LAB — BASIC METABOLIC PANEL
Anion gap: 5 (ref 5–15)
BUN: 13 mg/dL (ref 8–23)
CO2: 24 mmol/L (ref 22–32)
Calcium: 8.3 mg/dL — ABNORMAL LOW (ref 8.9–10.3)
Chloride: 107 mmol/L (ref 98–111)
Creatinine, Ser: 1.23 mg/dL — ABNORMAL HIGH (ref 0.44–1.00)
GFR calc Af Amer: 52 mL/min — ABNORMAL LOW (ref >60)
GFR calc non Af Amer: 45 mL/min — ABNORMAL LOW (ref >60)
Glucose, Bld: 153 mg/dL — ABNORMAL HIGH (ref 70–99)
Potassium: 3.5 mmol/L (ref 3.5–5.1)
Sodium: 136 mmol/L (ref 135–145)

## 2019-10-16 LAB — CBC WITH DIFFERENTIAL/PLATELET
Abs Immature Granulocytes: 0.03 10*3/uL (ref 0.00–0.07)
Basophils Absolute: 0 10*3/uL (ref 0.0–0.1)
Basophils Relative: 0 %
Eosinophils Absolute: 0 10*3/uL (ref 0.0–0.5)
Eosinophils Relative: 0 %
HCT: 38.4 % (ref 36.0–46.0)
Hemoglobin: 12.2 g/dL (ref 12.0–15.0)
Immature Granulocytes: 0 %
Lymphocytes Relative: 9 %
Lymphs Abs: 0.9 10*3/uL (ref 0.7–4.0)
MCH: 28 pg (ref 26.0–34.0)
MCHC: 31.8 g/dL (ref 30.0–36.0)
MCV: 88.1 fL (ref 80.0–100.0)
Monocytes Absolute: 1.4 10*3/uL — ABNORMAL HIGH (ref 0.1–1.0)
Monocytes Relative: 14 %
Neutro Abs: 7.6 10*3/uL (ref 1.7–7.7)
Neutrophils Relative %: 77 %
Platelets: 267 10*3/uL (ref 150–400)
RBC: 4.36 MIL/uL (ref 3.87–5.11)
RDW: 14.5 % (ref 11.5–15.5)
WBC: 9.9 10*3/uL (ref 4.0–10.5)
nRBC: 0 % (ref 0.0–0.2)

## 2019-10-16 LAB — GLUCOSE, CAPILLARY
Glucose-Capillary: 105 mg/dL — ABNORMAL HIGH (ref 70–99)
Glucose-Capillary: 113 mg/dL — ABNORMAL HIGH (ref 70–99)
Glucose-Capillary: 95 mg/dL (ref 70–99)
Glucose-Capillary: 118 mg/dL — ABNORMAL HIGH (ref 70–99)
Glucose-Capillary: 135 mg/dL — ABNORMAL HIGH (ref 70–99)

## 2019-10-16 LAB — MAGNESIUM: Magnesium: 1.7 mg/dL (ref 1.7–2.4)

## 2019-10-16 MED ORDER — ACETAMINOPHEN 500 MG PO TABS
1000.0000 mg | ORAL_TABLET | ORAL | Status: AC
  Filled 2019-10-16: qty 2

## 2019-10-16 MED ORDER — MAGNESIUM SULFATE 4 GM/100ML IV SOLN
4.0000 g | Freq: Once | INTRAVENOUS | Status: AC
  Administered 2019-10-16: 4 g via INTRAVENOUS
  Filled 2019-10-16: qty 100

## 2019-10-16 MED ORDER — GABAPENTIN 300 MG PO CAPS
300.0000 mg | ORAL_CAPSULE | ORAL | Status: AC
  Filled 2019-10-16: qty 1

## 2019-10-16 MED ORDER — POTASSIUM CHLORIDE CRYS ER 20 MEQ PO TBCR
40.0000 meq | EXTENDED_RELEASE_TABLET | Freq: Once | ORAL | Status: AC
  Administered 2019-10-16: 40 meq via ORAL
  Filled 2019-10-16: qty 2

## 2019-10-16 MED ORDER — SCOPOLAMINE 1 MG/3DAYS TD PT72
1.0000 | MEDICATED_PATCH | TRANSDERMAL | Status: DC
  Filled 2019-10-16 (×2): qty 1

## 2019-10-16 NOTE — Progress Notes (Signed)
[facility] Gastroenterology Progress Note  Ann Mcguire 69 y.o. [birth year]  CC:  Symptomatic cholelithiasis, CBD dilation  Subjective: Patient reports continued RUQ abdominal pain. Denies nausea or vomiting.  ROS : Review of Systems  Constitutional: Negative for chills and fever.  Cardiovascular: Negative for chest pain and palpitations.  Gastrointestinal: Positive for abdominal pain (RUQ). Negative for blood in stool, constipation, diarrhea, heartburn, melena, nausea and vomiting.   Objective: Vital signs in last 24 hours: Vitals:   10/16/19 0624 10/16/19 0814  BP: 118/62 131/73  Pulse: 70 67  Resp: 18 16  Temp: 98.6 F (37 C) 98.6 F (37 C)  SpO2: 95% 99%    Physical Exam:  General:  Alert, oriented, cooperative, no acute distress  Head:  Normocephalic, without obvious abnormality, atraumatic  Eyes:  Anicteric sclera, EOMs intact  Lungs:   Breathing comfortably on room air  Heart:  Regular rate and rhythm  Abdomen:   Soft, RUQ tenderness with guarding, nondistended, no peritoneal signs.  Extremities: Extremities normal, atraumatic, no  edema  Pulses: 2+ and symmetric    Lab Results: Recent Labs    10/15/19 0459 10/16/19 0429  NA 142 136  K 4.2 3.5  CL 107 107  CO2 26 24  GLUCOSE 98 153*  BUN 11 13  CREATININE 1.11* 1.23*  CALCIUM 9.1 8.3*  MG  --  1.7   Recent Labs    10/15/19 0459 10/16/19 0429  AST 38 21  ALT 40 26  ALKPHOS 62 53  BILITOT 1.0 0.9  PROT 6.7 6.2*  ALBUMIN 3.4* 3.1*   Recent Labs    10/15/19 0459 10/16/19 0429  WBC 7.7 9.9  NEUTROABS 5.5 7.6  HGB 12.9 12.2  HCT 41.6 38.4  MCV 90.0 88.1  PLT 254 267   No results for input(s): LABPROT, INR in the last 72 hours.  Impression: Cholelithiasis and dilated CBD -HIDA scan 7/8: Nonvisualization of the gallbladder suggesting cystic duct obstruction. The common bile duct is patent. -MRCP 7/7 showed: numerous gallstones in the gallbladder, which is mildly distended. No gallbladder  wall thickening or pericholecystic fluid. Mild intrahepatic biliary ductal dilatation and dilatation of the common bile duct up to 1.2 cm without filling defect visualized to the ampulla. The pancreatic duct is mildly prominent, measuring up to 3 mm in caliber. Again, no obstructing lesion identified.   -LFTS improving, now within normal limits: T bili 0.9/AST 21/ALT 26/ALP 53 -No leukocytosis, though WBCs are gradually increasing, now at 9.9  Plan: Patient is scheduled for laparoscopic cholecystectomy today.  Patient is due to repeat screening colonoscopy as an outpatient.  Patient was given contact information for our office and advised to contact us to regarding this.  [facility] will sign off.  Please contact us if we can be of any further assistance during this hospital stay.  Salley Slaughter PA-C 10/16/2019, 8:43 AM  Contact #  [phone]

## 2019-10-16 NOTE — Progress Notes (Signed)
PROGRESS NOTE    Ann Mcguire  YTK:[id number] DOB: 03/02/1951 DOA: 10/14/2019 PCP: Libby Maw, MD    Chief Complaint  Patient presents with   Abdominal Pain    to be admitted    Brief Narrative:  Patient is a pleasant 69 year old female history of hypertension, obesity, type 2 diabetes presenting right upper quadrant pain which has progressively worsened over the past 4 days.  Pain does wax and wane with associated symptoms of nausea but no vomiting or diarrhea.  Right upper quadrant pain associated with food intake.  CT of the abdomen and pelvis done showed dilatation of the gallbladder with mild central biliary duct dilatation and common bile duct dilatation with changes of gallbladder sludge and small stones noted.  Large left ovarian cyst of 7.7 cm noted.  Right upper quadrant ultrasound done consistent with cholelithiasis and gallbladder sludge and negative for Murphy sign.  GI consulted who recommended pain control and MRCP.  MRCP done with numerous gallstones in the gallbladder which was mildly distended, no gallbladder wall thickening or pericholecystic fluid, mild intrahepatic biliary ductal dilatation and dilatation of the CBD to 1.2 cm without filling defect visualized to the ampulla, pancreatic duct mildly prominent measuring 3 mm in caliber with no obstructing lesion identified.  HIDA scan was done which is currently pending.  GI recommended general surgery evaluation for symptomatic cholelithiasis/biliary colic for evaluation for lap cholecystectomy.   Assessment & Plan:   Principal Problem:   Symptomatic cholelithiasis Active Problems:   Biliary colic   Essential hypertension   Type 2 diabetes mellitus without complication, without long-term current use of insulin (HCC)   Abdominal pain   Common bile duct dilatation   Elevated LFTs   Obesity (BMI 30-39.9)   Ovarian cyst   AKI (acute kidney injury) ([place])  1 Symptomatic cholelithiasis/biliary  colic Patient presented with worsening right upper quadrant abdominal pain x4 days, nausea, pain aggravated by food.  CT abdomen and pelvis done dilation of the gallbladder with mild central biliary ductal dilatation and common bile duct dilatation.  Changes of gallbladder sludge and small stones noted.  Changes suggest distal common bile duct stone.  Large left ovarian cyst measuring 7.7 cm.  Right upper quadrant ultrasound was done consistent with cholelithiasis and gallbladder sludge without complicating factors.  Dilated common bile duct suspicious for distal stone similar to that seen on prior exam.  MRCP done which showed numerous gallstones in the gallbladder which was mildly distended, no gallbladder wall thickening or pericholecystic fluid.  Mild intrahepatic biliary ductal dilatation and dilatation of the common bile duct up to 1.2 cm without filling defect visualized to the ampulla.  Pancreatic duct mildly prominent measuring up to 3 mm in caliber with no obstructing lesion identified.  HIDA scan with nonvisualization of the gallbladder suggesting cystic duct obstruction.  Common bile duct is patent.  Patient seen in consultation by GI who feel patient does not need a ERCP at this time.  GI recommended evaluation by general surgery for lap cholecystectomy.  Patient seen in consultation by general surgery and patient for laparoscopic cholecystectomy hopefully today.  Continue IV fluids.  Pain management.  GI general surgery following and I appreciate the input and recommendations.   2.  Acute kidney injury Likely secondary to a prerenal azotemia in the setting of HCTZ and lisinopril.  Renal function improved with hydration.  Continue D5 normal saline.  Follow.   3.  Hypertension HCTZ and lisinopril on hold secondary to problem #2.  Blood pressure with some improvement after starting Norvasc 5 mg daily.  Hydralazine as needed.    4.  Obesity  5.  Incidental finding of large left ovarian cyst  measuring 7.7 cm Noted on CT scan.  Outpatient follow-up with PCP for ultrasound and further evaluation.  6.  Well-controlled type 2 diabetes mellitus Hemoglobin A1c of 6.4.  Patient noted to have a CBG of 113 this morning.  Patient currently n.p.o.  Continue to hold oral hypoglycemic agents.  Continue D5 normal saline.     DVT prophylaxis: SCDs Code Status: Full Family Communication: Updated patient.  No family at bedside. Disposition:   Status is: Observation    Dispo: The patient is from: Home              Anticipated d/c is to: Home              Anticipated d/c date is: In 1 to 2 days              Patient currently with symptomatic cholelithiasis/biliary colic currently being worked up.  GI and general surgery consulted and patient for lap cholecystectomy today.  Not stable for discharge.         Consultants:   Gastroenterology: Dr. Michail Sermon 10/14/2019  General surgery: Dr. Barry Dienes 10/15/2019  Procedures:   CT abdomen and pelvis 10/14/2019  Right upper quadrant ultrasound 10/14/2019  HIDA scan 10/15/2019  MRCP 10/14/2019  Antimicrobials:   None   Subjective: Patient laying in bed.  Stated ate about 25% of soft diet yesterday and tolerated it.  Denies any emesis.  No nausea.  Complains of right upper quadrant pain this morning.  No shortness of breath.  No chest pain.   Objective: Vitals:   10/15/19 1457 10/15/19 2123 10/16/19 0624 10/16/19 0814  BP: (!) 160/65 (!) 142/68 118/62 131/73  Pulse: 67 76 70 67  Resp: 18 18 18 16   Temp: 98.9 F (37.2 C) 98.4 F (36.9 C) 98.6 F (37 C) 98.6 F (37 C)  TempSrc: Oral Oral Oral   SpO2: 94% 94% 95% 99%  Weight: 95.7 kg     Height: 5\' 6"  (1.676 m)       Intake/Output Summary (Last 24 hours) at 10/16/2019 1000 Last data filed at 10/16/2019 0600 Gross per 24 hour  Intake 1932.75 ml  Output 1400 ml  Net 532.75 ml   Filed Weights   10/15/19 1457  Weight: 95.7 kg    Examination:  General exam: NAD Respiratory  system: Lungs clear to auscultation bilaterally.  No wheezes, no crackles, no rhonchi.  Normal respiratory effort. Cardiovascular system: RRR no murmurs rubs or gallops.  No JVD.  No lower extremity edema.  Gastrointestinal system: Abdomen is soft, nondistended, tenderness to palpation right upper quadrant, positive bowel sounds.  No rebound.  No guarding.  Central nervous system: Alert and oriented. No focal neurological deficits. Extremities: Symmetric 5 x 5 power. Skin: No rashes, lesions or ulcers Psychiatry: Judgement and insight appear normal. Mood & affect appropriate.     Data Reviewed: I have personally reviewed following labs and imaging studies  CBC: Recent Labs  Lab 10/13/19 2135 10/14/19 1216 10/15/19 0459 10/16/19 0429  WBC 5.6 5.1 7.7 9.9  NEUTROABS  --  3.1 5.5 7.6  HGB 14.0 12.9 12.9 12.2  HCT 44.0 41.4 41.6 38.4  MCV 87.1 88.1 90.0 88.1  PLT 329 320 254 462    Basic Metabolic Panel: Recent Labs  Lab 10/13/19 2135 10/14/19 1216 10/15/19 0459  10/16/19 0429  NA 141 143 142 136  K 3.9 4.1 4.2 3.5  CL 105 106 107 107  CO2 25 24 26 24   GLUCOSE 106* 96 98 153*  BUN 15 14 11 13   CREATININE 1.20* 1.26* 1.11* 1.23*  CALCIUM 9.2 9.4 9.1 8.3*  MG  --   --   --  1.7    GFR: Estimated Creatinine Clearance: 50.4 mL/min (A) (by C-G formula based on SCr of 1.23 mg/dL (H)).  Liver Function Tests: Recent Labs  Lab 10/13/19 2135 10/14/19 1216 10/15/19 0459 10/16/19 0429  AST 21 86* 38 21  ALT 17 55* 40 26  ALKPHOS 61 70 62 53  BILITOT 1.0 0.8 1.0 0.9  PROT 7.8 7.2 6.7 6.2*  ALBUMIN 4.1 3.8 3.4* 3.1*    CBG: Recent Labs  Lab 10/15/19 0712 10/15/19 1123 10/15/19 1502 10/15/19 2225 10/16/19 0736  GLUCAP 94 65* 80 95 113*     Recent Results (from the past 240 hour(s))  SARS Coronavirus 2 by RT PCR (hospital order, performed in [facility] hospital lab) Nasopharyngeal Nasopharyngeal Swab     Status: None   Collection Time: 10/14/19  1:54 AM    Specimen: Nasopharyngeal Swab  Result Value Ref Range Status   SARS Coronavirus 2 NEGATIVE NEGATIVE Final    Comment: (NOTE) SARS-CoV-2 target nucleic acids are NOT DETECTED.  The SARS-CoV-2 RNA is generally detectable in upper and lower respiratory specimens during the acute phase of infection. The lowest concentration of SARS-CoV-2 viral copies this assay can detect is 250 copies / mL. A negative result does not preclude SARS-CoV-2 infection and should not be used as the sole basis for treatment or other patient management decisions.  A negative result may occur with improper specimen collection / handling, submission of specimen other than nasopharyngeal swab, presence of viral mutation(s) within the areas targeted by this assay, and inadequate number of viral copies (<250 copies / mL). A negative result must be combined with clinical observations, patient history, and epidemiological information.  Fact Sheet for Patients:   StrictlyIdeas.no  Fact Sheet for Healthcare Providers: BankingDealers.co.za  This test is not yet approved or  cleared by the [place] FDA and has been authorized for detection and/or diagnosis of SARS-CoV-2 by FDA under an Emergency Use Authorization (EUA).  This EUA will remain in effect (meaning this test can be used) for the duration of the COVID-19 declaration under Section 564(b)(1) of the Act, 21 U.S.C. section 360bbb-3(b)(1), unless the authorization is terminated or revoked sooner.  Performed at [facility], [address] [place], [place] [address]   MRSA PCR Screening     Status: None   Collection Time: 10/16/19  1:04 AM   Specimen: Nasal Mucosa; Nasopharyngeal  Result Value Ref Range Status   MRSA by PCR NEGATIVE NEGATIVE Final    Comment:        The GeneXpert MRSA Assay (FDA approved for NASAL specimens only), is one component of a comprehensive MRSA  colonization surveillance program. It is not intended to diagnose MRSA infection nor to guide or monitor treatment for MRSA infections. Performed at [facility], [address], [place], [place] [address]          Radiology Studies: NM Hepatobiliary Liver Func  Result Date: 10/15/2019 CLINICAL DATA:  Right upper quadrant abdominal pain and cholelithiasis. EXAM: NUCLEAR MEDICINE HEPATOBILIARY IMAGING TECHNIQUE: Sequential images of the abdomen were obtained out to 60 minutes following intravenous administration of radiopharmaceutical.  RADIOPHARMACEUTICALS:  4.79 mCi Tc-82m  Choletec IV COMPARISON:  MRI 10/14/2019 FINDINGS: Prompt symmetric uptake in the liver is demonstrated along with prompt excretion into the biliary tree which is visualized by 5 minutes. Activity is seen in the duodenum at 5 minutes also. Gallbladder was not observed up 60 minutes. Subsequently the patient received 3 mg of morphine sulfate IV and imaging was performed over an additional 30 minutes. The gallbladder was never visualized. IMPRESSION: Nonvisualization of the gallbladder suggesting cystic duct obstruction. The common bile duct is patent. Electronically Signed   By: Marijo Sanes M.D.   On: 10/15/2019 16:19   MR 3D Recon At Scanner  Result Date: 10/14/2019 CLINICAL DATA:  Nausea, right upper quadrant pain since Saturday, cholelithiasis EXAM: MRI ABDOMEN WITHOUT AND WITH CONTRAST (INCLUDING MRCP) TECHNIQUE: Multiplanar multisequence MR imaging of the abdomen was performed both before and after the administration of intravenous contrast. Heavily T2-weighted images of the biliary and pancreatic ducts were obtained, and three-dimensional MRCP images were rendered by post processing. CONTRAST:  68mL GADAVIST GADOBUTROL 1 MMOL/ML IV SOLN COMPARISON:  CT abdomen pelvis, 10/14/2019, abdominal ultrasound, 10/14/2019 FINDINGS: Lower chest: No acute findings. Hepatobiliary: No mass or other parenchymal  abnormality identified. Numerous gallstones in the gallbladder, which is mildly distended. No gallbladder wall thickening or pericholecystic fluid. There is mild intrahepatic biliary ductal dilatation and dilatation of the common bile duct up to 1.2 cm without filling defect visualized to the ampulla. Pancreas: No mass, inflammatory changes, or other parenchymal abnormality identified. The pancreatic duct is mildly prominent, measuring up to 3 mm in caliber. Spleen:  Within normal limits in size and appearance. Adrenals/Urinary Tract: No masses identified. No evidence of hydronephrosis. Stomach/Bowel: Visualized portions within the abdomen are unremarkable. Vascular/Lymphatic: No pathologically enlarged lymph nodes identified. No abdominal aortic aneurysm demonstrated. Other:  None. Musculoskeletal: No suspicious bone lesions identified. IMPRESSION: 1. Numerous gallstones in the gallbladder, which is mildly distended. No gallbladder wall thickening or pericholecystic fluid. 2. Mild intrahepatic biliary ductal dilatation and dilatation of the common bile duct up to 1.2 cm without filling defect visualized to the ampulla. 3. The pancreatic duct is mildly prominent, measuring up to 3 mm in caliber. Again, no obstructing lesion identified. 4. Patency of the cystic and common bile ducts may be evaluated by HIDA or ERCP if desired. Electronically Signed   By: Eddie Candle M.D.   On: 10/14/2019 15:37   MR ABDOMEN MRCP W WO CONTAST  Result Date: 10/14/2019 CLINICAL DATA:  Nausea, right upper quadrant pain since Saturday, cholelithiasis EXAM: MRI ABDOMEN WITHOUT AND WITH CONTRAST (INCLUDING MRCP) TECHNIQUE: Multiplanar multisequence MR imaging of the abdomen was performed both before and after the administration of intravenous contrast. Heavily T2-weighted images of the biliary and pancreatic ducts were obtained, and three-dimensional MRCP images were rendered by post processing. CONTRAST:  23mL GADAVIST GADOBUTROL 1  MMOL/ML IV SOLN COMPARISON:  CT abdomen pelvis, 10/14/2019, abdominal ultrasound, 10/14/2019 FINDINGS: Lower chest: No acute findings. Hepatobiliary: No mass or other parenchymal abnormality identified. Numerous gallstones in the gallbladder, which is mildly distended. No gallbladder wall thickening or pericholecystic fluid. There is mild intrahepatic biliary ductal dilatation and dilatation of the common bile duct up to 1.2 cm without filling defect visualized to the ampulla. Pancreas: No mass, inflammatory changes, or other parenchymal abnormality identified. The pancreatic duct is mildly prominent, measuring up to 3 mm in caliber. Spleen:  Within normal limits in size and appearance. Adrenals/Urinary Tract: No masses identified. No evidence of hydronephrosis. Stomach/Bowel: Visualized  portions within the abdomen are unremarkable. Vascular/Lymphatic: No pathologically enlarged lymph nodes identified. No abdominal aortic aneurysm demonstrated. Other:  None. Musculoskeletal: No suspicious bone lesions identified. IMPRESSION: 1. Numerous gallstones in the gallbladder, which is mildly distended. No gallbladder wall thickening or pericholecystic fluid. 2. Mild intrahepatic biliary ductal dilatation and dilatation of the common bile duct up to 1.2 cm without filling defect visualized to the ampulla. 3. The pancreatic duct is mildly prominent, measuring up to 3 mm in caliber. Again, no obstructing lesion identified. 4. Patency of the cystic and common bile ducts may be evaluated by HIDA or ERCP if desired. Electronically Signed   By: Eddie Candle M.D.   On: 10/14/2019 15:37        Scheduled Meds:  acetaminophen  1,000 mg Oral On Call to OR   amLODipine  5 mg Oral Daily   dicyclomine  10 mg Oral TID AC & HS   gabapentin  300 mg Oral On Call to OR   pantoprazole  40 mg Oral Q0600   scopolamine  1 patch Transdermal On Call to OR   Continuous Infusions:  dextrose 5 % and 0.9% NaCl 100 mL/hr at 10/16/19  0600   magnesium sulfate bolus IVPB       LOS: 1 day    Time spent: 35 minutes    Irine Seal, MD [facility]   To contact the attending provider between 7A-7P or the covering provider during after hours 7P-7A, please log into the web site [email or web address] and access using universal [place] password for that web site. If you do not have the password, please call the hospital operator.  10/16/2019, 10:00 AM

## 2019-10-16 NOTE — Progress Notes (Signed)
Day of Surgery   Subjective/Chief Complaint: Pain in RUQ.    Objective: Vital signs in last 24 hours: Temp:  [98.4 F (36.9 C)-98.9 F (37.2 C)] 98.6 F (37 C) (07/09 0814) Pulse Rate:  [67-76] 67 (07/09 0814) Resp:  [16-18] 16 (07/09 0814) BP: (118-160)/(62-73) 131/73 (07/09 0814) SpO2:  [94 %-99 %] 99 % (07/09 0814) Weight:  [95.7 kg] 95.7 kg (07/08 1457) Last BM Date: 10/12/19  Intake/Output from previous day: 07/08 0701 - 07/09 0700 In: 3103.7 [P.O.:360; I.V.:2743.7] Out: 1400 [Urine:1400] Intake/Output this shift: Total I/O In: 0  Out: 500 [Urine:500]  General appearance: alert, cooperative and no distress Resp: breathing comfortably GI: soft, non distended, approp tender RUQ Extremities: extremities normal, atraumatic, no cyanosis or edema  Lab Results:  Recent Labs    10/15/19 0459 10/16/19 0429  WBC 7.7 9.9  HGB 12.9 12.2  HCT 41.6 38.4  PLT 254 267   BMET Recent Labs    10/15/19 0459 10/16/19 0429  NA 142 136  K 4.2 3.5  CL 107 107  CO2 26 24  GLUCOSE 98 153*  BUN 11 13  CREATININE 1.11* 1.23*  CALCIUM 9.1 8.3*   PT/INR No results for input(s): LABPROT, INR in the last 72 hours. ABG No results for input(s): PHART, HCO3 in the last 72 hours.  Invalid input(s): PCO2, PO2  Studies/Results: NM Hepatobiliary Liver Func  Result Date: 10/15/2019 CLINICAL DATA:  Right upper quadrant abdominal pain and cholelithiasis. EXAM: NUCLEAR MEDICINE HEPATOBILIARY IMAGING TECHNIQUE: Sequential images of the abdomen were obtained out to 60 minutes following intravenous administration of radiopharmaceutical. RADIOPHARMACEUTICALS:  4.79 mCi Tc-16m  Choletec IV COMPARISON:  MRI 10/14/2019 FINDINGS: Prompt symmetric uptake in the liver is demonstrated along with prompt excretion into the biliary tree which is visualized by 5 minutes. Activity is seen in the duodenum at 5 minutes also. Gallbladder was not observed up 60 minutes. Subsequently the patient received 3 mg  of morphine sulfate IV and imaging was performed over an additional 30 minutes. The gallbladder was never visualized. IMPRESSION: Nonvisualization of the gallbladder suggesting cystic duct obstruction. The common bile duct is patent. Electronically Signed   By: Marijo Sanes M.D.   On: 10/15/2019 16:19   MR 3D Recon At Scanner  Result Date: 10/14/2019 CLINICAL DATA:  Nausea, right upper quadrant pain since Saturday, cholelithiasis EXAM: MRI ABDOMEN WITHOUT AND WITH CONTRAST (INCLUDING MRCP) TECHNIQUE: Multiplanar multisequence MR imaging of the abdomen was performed both before and after the administration of intravenous contrast. Heavily T2-weighted images of the biliary and pancreatic ducts were obtained, and three-dimensional MRCP images were rendered by post processing. CONTRAST:  79mL GADAVIST GADOBUTROL 1 MMOL/ML IV SOLN COMPARISON:  CT abdomen pelvis, 10/14/2019, abdominal ultrasound, 10/14/2019 FINDINGS: Lower chest: No acute findings. Hepatobiliary: No mass or other parenchymal abnormality identified. Numerous gallstones in the gallbladder, which is mildly distended. No gallbladder wall thickening or pericholecystic fluid. There is mild intrahepatic biliary ductal dilatation and dilatation of the common bile duct up to 1.2 cm without filling defect visualized to the ampulla. Pancreas: No mass, inflammatory changes, or other parenchymal abnormality identified. The pancreatic duct is mildly prominent, measuring up to 3 mm in caliber. Spleen:  Within normal limits in size and appearance. Adrenals/Urinary Tract: No masses identified. No evidence of hydronephrosis. Stomach/Bowel: Visualized portions within the abdomen are unremarkable. Vascular/Lymphatic: No pathologically enlarged lymph nodes identified. No abdominal aortic aneurysm demonstrated. Other:  None. Musculoskeletal: No suspicious bone lesions identified. IMPRESSION: 1. Numerous gallstones in the gallbladder,  which is mildly distended. No  gallbladder wall thickening or pericholecystic fluid. 2. Mild intrahepatic biliary ductal dilatation and dilatation of the common bile duct up to 1.2 cm without filling defect visualized to the ampulla. 3. The pancreatic duct is mildly prominent, measuring up to 3 mm in caliber. Again, no obstructing lesion identified. 4. Patency of the cystic and common bile ducts may be evaluated by HIDA or ERCP if desired. Electronically Signed   By: Eddie Candle M.D.   On: 10/14/2019 15:37   MR ABDOMEN MRCP W WO CONTAST  Result Date: 10/14/2019 CLINICAL DATA:  Nausea, right upper quadrant pain since Saturday, cholelithiasis EXAM: MRI ABDOMEN WITHOUT AND WITH CONTRAST (INCLUDING MRCP) TECHNIQUE: Multiplanar multisequence MR imaging of the abdomen was performed both before and after the administration of intravenous contrast. Heavily T2-weighted images of the biliary and pancreatic ducts were obtained, and three-dimensional MRCP images were rendered by post processing. CONTRAST:  32mL GADAVIST GADOBUTROL 1 MMOL/ML IV SOLN COMPARISON:  CT abdomen pelvis, 10/14/2019, abdominal ultrasound, 10/14/2019 FINDINGS: Lower chest: No acute findings. Hepatobiliary: No mass or other parenchymal abnormality identified. Numerous gallstones in the gallbladder, which is mildly distended. No gallbladder wall thickening or pericholecystic fluid. There is mild intrahepatic biliary ductal dilatation and dilatation of the common bile duct up to 1.2 cm without filling defect visualized to the ampulla. Pancreas: No mass, inflammatory changes, or other parenchymal abnormality identified. The pancreatic duct is mildly prominent, measuring up to 3 mm in caliber. Spleen:  Within normal limits in size and appearance. Adrenals/Urinary Tract: No masses identified. No evidence of hydronephrosis. Stomach/Bowel: Visualized portions within the abdomen are unremarkable. Vascular/Lymphatic: No pathologically enlarged lymph nodes identified. No abdominal aortic  aneurysm demonstrated. Other:  None. Musculoskeletal: No suspicious bone lesions identified. IMPRESSION: 1. Numerous gallstones in the gallbladder, which is mildly distended. No gallbladder wall thickening or pericholecystic fluid. 2. Mild intrahepatic biliary ductal dilatation and dilatation of the common bile duct up to 1.2 cm without filling defect visualized to the ampulla. 3. The pancreatic duct is mildly prominent, measuring up to 3 mm in caliber. Again, no obstructing lesion identified. 4. Patency of the cystic and common bile ducts may be evaluated by HIDA or ERCP if desired. Electronically Signed   By: Eddie Candle M.D.   On: 10/14/2019 15:37    Anti-infectives: Anti-infectives (From admission, onward)   None      Assessment/Plan: Acute calculous cholecystitis. Surgery planned for today, but will likely need to bump to tomorrow given surgery schedule.   Will update.  I reviewed surgery and risks with patient including risk of bile duct injury and bile leak.      LOS: 1 day    Stark Klein 10/16/2019

## 2019-10-16 NOTE — Plan of Care (Signed)

## 2019-10-17 ENCOUNTER — Encounter (HOSPITAL_COMMUNITY)
Admission: EM | Disposition: A | Discharge status: Home / Self Care | Payer: Self-pay | Source: Home / Self Care | Attending: Internal Medicine

## 2019-10-17 ENCOUNTER — Inpatient Hospital Stay (HOSPITAL_COMMUNITY): Payer: BC Managed Care – PPO | Admitting: Certified Registered Nurse Anesthetist

## 2019-10-17 DIAGNOSIS — K810 Acute cholecystitis: Secondary | ICD-10-CM

## 2019-10-17 HISTORY — PX: CHOLECYSTECTOMY: SHX55

## 2019-10-17 LAB — COMPREHENSIVE METABOLIC PANEL
ALT: 25 U/L (ref 0–44)
AST: 19 U/L (ref 15–41)
Albumin: 2.9 g/dL — ABNORMAL LOW (ref 3.5–5.0)
Alkaline Phosphatase: 54 U/L (ref 38–126)
Anion gap: 4 — ABNORMAL LOW (ref 5–15)
BUN: 8 mg/dL (ref 8–23)
CO2: 24 mmol/L (ref 22–32)
Calcium: 8 mg/dL — ABNORMAL LOW (ref 8.9–10.3)
Chloride: 107 mmol/L (ref 98–111)
Creatinine, Ser: 1.08 mg/dL — ABNORMAL HIGH (ref 0.44–1.00)
GFR calc Af Amer: >60 mL/min (ref >60)
GFR calc non Af Amer: 52 mL/min — ABNORMAL LOW (ref >60)
Glucose, Bld: 143 mg/dL — ABNORMAL HIGH (ref 70–99)
Potassium: 3.8 mmol/L (ref 3.5–5.1)
Sodium: 135 mmol/L (ref 135–145)
Total Bilirubin: 0.6 mg/dL (ref 0.3–1.2)
Total Protein: 6.2 g/dL — ABNORMAL LOW (ref 6.5–8.1)

## 2019-10-17 LAB — CBC WITH DIFFERENTIAL/PLATELET
Abs Immature Granulocytes: 0.01 10*3/uL (ref 0.00–0.07)
Basophils Absolute: 0 10*3/uL (ref 0.0–0.1)
Basophils Relative: 0 %
Eosinophils Absolute: 0.1 10*3/uL (ref 0.0–0.5)
Eosinophils Relative: 1 %
HCT: 37.2 % (ref 36.0–46.0)
Hemoglobin: 11.7 g/dL — ABNORMAL LOW (ref 12.0–15.0)
Immature Granulocytes: 0 %
Lymphocytes Relative: 15 %
Lymphs Abs: 1.1 10*3/uL (ref 0.7–4.0)
MCH: 27.7 pg (ref 26.0–34.0)
MCHC: 31.5 g/dL (ref 30.0–36.0)
MCV: 87.9 fL (ref 80.0–100.0)
Monocytes Absolute: 1 10*3/uL (ref 0.1–1.0)
Monocytes Relative: 14 %
Neutro Abs: 5.1 10*3/uL (ref 1.7–7.7)
Neutrophils Relative %: 70 %
Platelets: 266 10*3/uL (ref 150–400)
RBC: 4.23 MIL/uL (ref 3.87–5.11)
RDW: 14.6 % (ref 11.5–15.5)
WBC: 7.3 10*3/uL (ref 4.0–10.5)
nRBC: 0 % (ref 0.0–0.2)

## 2019-10-17 LAB — GLUCOSE, CAPILLARY
Glucose-Capillary: 156 mg/dL — ABNORMAL HIGH (ref 70–99)
Glucose-Capillary: 143 mg/dL — ABNORMAL HIGH (ref 70–99)
Glucose-Capillary: 159 mg/dL — ABNORMAL HIGH (ref 70–99)

## 2019-10-17 LAB — MAGNESIUM: Magnesium: 2.3 mg/dL (ref 1.7–2.4)

## 2019-10-17 SURGERY — LAPAROSCOPIC CHOLECYSTECTOMY WITH INTRAOPERATIVE CHOLANGIOGRAM
Anesthesia: General

## 2019-10-17 SURGERY — LAPAROSCOPIC CHOLECYSTECTOMY WITH INTRAOPERATIVE CHOLANGIOGRAM
Anesthesia: General | Site: Abdomen

## 2019-10-17 MED ORDER — CEFAZOLIN SODIUM-DEXTROSE 2-4 GM/100ML-% IV SOLN
INTRAVENOUS | Status: AC
  Filled 2019-10-17: qty 100

## 2019-10-17 MED ORDER — ROCURONIUM BROMIDE 10 MG/ML (PF) SYRINGE
PREFILLED_SYRINGE | INTRAVENOUS | Status: AC
  Filled 2019-10-17: qty 10

## 2019-10-17 MED ORDER — GABAPENTIN 300 MG PO CAPS
300.0000 mg | ORAL_CAPSULE | Freq: Once | ORAL | Status: AC
  Administered 2019-10-17: 300 mg via ORAL

## 2019-10-17 MED ORDER — PROPOFOL 10 MG/ML IV BOLUS
INTRAVENOUS | Status: DC | PRN
  Administered 2019-10-17: 200 mg via INTRAVENOUS

## 2019-10-17 MED ORDER — SUCCINYLCHOLINE CHLORIDE 200 MG/10ML IV SOSY
PREFILLED_SYRINGE | INTRAVENOUS | Status: AC
  Filled 2019-10-17: qty 10

## 2019-10-17 MED ORDER — MIDAZOLAM HCL 5 MG/5ML IJ SOLN
INTRAMUSCULAR | Status: DC | PRN
  Administered 2019-10-17: 2 mg via INTRAVENOUS

## 2019-10-17 MED ORDER — LACTATED RINGERS IV SOLN: INTRAVENOUS | Status: DC

## 2019-10-17 MED ORDER — SUCCINYLCHOLINE CHLORIDE 200 MG/10ML IV SOSY
PREFILLED_SYRINGE | INTRAVENOUS | Status: DC | PRN
  Administered 2019-10-17: 120 mg via INTRAVENOUS

## 2019-10-17 MED ORDER — TRAMADOL HCL 50 MG PO TABS
50.0000 mg | ORAL_TABLET | Freq: Four times a day (QID) | ORAL | Status: DC | PRN
  Administered 2019-10-17: 50 mg via ORAL

## 2019-10-17 MED ORDER — FENTANYL CITRATE (PF) 100 MCG/2ML IJ SOLN
INTRAMUSCULAR | Status: AC
  Filled 2019-10-17: qty 2

## 2019-10-17 MED ORDER — SCOPOLAMINE 1 MG/3DAYS TD PT72
MEDICATED_PATCH | TRANSDERMAL | Status: DC | PRN
  Administered 2019-10-17: 1 via TRANSDERMAL

## 2019-10-17 MED ORDER — 0.9 % SODIUM CHLORIDE (POUR BTL) OPTIME
TOPICAL | Status: DC | PRN
  Administered 2019-10-17: 1000 mL

## 2019-10-17 MED ORDER — FENTANYL CITRATE (PF) 100 MCG/2ML IJ SOLN
25.0000 ug | INTRAMUSCULAR | Status: DC | PRN
  Administered 2019-10-17 (×3): 50 ug via INTRAVENOUS

## 2019-10-17 MED ORDER — FENTANYL CITRATE (PF) 100 MCG/2ML IJ SOLN
INTRAMUSCULAR | Status: DC | PRN
  Administered 2019-10-17: 100 ug via INTRAVENOUS
  Administered 2019-10-17 (×2): 50 ug via INTRAVENOUS

## 2019-10-17 MED ORDER — ONDANSETRON HCL 4 MG/2ML IJ SOLN: 4.0000 mg | Freq: Four times a day (QID) | INTRAMUSCULAR | Status: DC | PRN

## 2019-10-17 MED ORDER — ROCURONIUM BROMIDE 10 MG/ML (PF) SYRINGE
PREFILLED_SYRINGE | INTRAVENOUS | Status: DC | PRN
  Administered 2019-10-17: 40 mg via INTRAVENOUS

## 2019-10-17 MED ORDER — DEXTROSE-NACL 5-0.9 % IV SOLN
INTRAVENOUS | Status: DC
  Administered 2019-10-17: 75 mL/h via INTRAVENOUS

## 2019-10-17 MED ORDER — OXYCODONE HCL 5 MG/5ML PO SOLN: 5.0000 mg | Freq: Once | ORAL | Status: DC | PRN

## 2019-10-17 MED ORDER — PROPOFOL 10 MG/ML IV BOLUS
INTRAVENOUS | Status: AC
  Filled 2019-10-17: qty 20

## 2019-10-17 MED ORDER — ACETAMINOPHEN 325 MG PO TABS
ORAL_TABLET | ORAL | Status: DC | PRN
Start: 2019-10-17 — End: 2019-10-17
  Administered 2019-10-17: 1000 mg via ORAL

## 2019-10-17 MED ORDER — LIDOCAINE HCL (PF) 1 % IJ SOLN
INTRAMUSCULAR | Status: AC
  Filled 2019-10-17: qty 30

## 2019-10-17 MED ORDER — BUPIVACAINE-EPINEPHRINE (PF) 0.25% -1:200000 IJ SOLN
INTRAMUSCULAR | Status: DC | PRN
  Administered 2019-10-17: 20 mL

## 2019-10-17 MED ORDER — LACTATED RINGERS IV SOLN
INTRAVENOUS | Status: AC | PRN
  Administered 2019-10-17: 1000 mL

## 2019-10-17 MED ORDER — ONDANSETRON HCL 4 MG/2ML IJ SOLN
INTRAMUSCULAR | Status: AC
  Filled 2019-10-17: qty 2

## 2019-10-17 MED ORDER — SUGAMMADEX SODIUM 200 MG/2ML IV SOLN
INTRAVENOUS | Status: DC | PRN
  Administered 2019-10-17: 200 mg via INTRAVENOUS

## 2019-10-17 MED ORDER — HYDROMORPHONE HCL 1 MG/ML IJ SOLN: 1.0000 mg | INTRAMUSCULAR | Status: DC | PRN

## 2019-10-17 MED ORDER — DEXAMETHASONE SODIUM PHOSPHATE 10 MG/ML IJ SOLN
INTRAMUSCULAR | Status: DC | PRN
  Administered 2019-10-17: 4 mg via INTRAVENOUS

## 2019-10-17 MED ORDER — BUPIVACAINE-EPINEPHRINE (PF) 0.25% -1:200000 IJ SOLN
INTRAMUSCULAR | Status: AC
  Filled 2019-10-17: qty 30

## 2019-10-17 MED ORDER — TRAMADOL HCL 50 MG PO TABS: 50.0000 mg | ORAL_TABLET | Freq: Four times a day (QID) | ORAL | 0 refills | Status: DC | PRN

## 2019-10-17 MED ORDER — LIDOCAINE 2% (20 MG/ML) 5 ML SYRINGE
INTRAMUSCULAR | Status: AC
  Filled 2019-10-17: qty 5

## 2019-10-17 MED ORDER — AMOXICILLIN-POT CLAVULANATE 875-125 MG PO TABS
1.0000 | ORAL_TABLET | Freq: Two times a day (BID) | ORAL | Status: DC
  Administered 2019-10-17: 1 via ORAL
  Filled 2019-10-17: qty 1

## 2019-10-17 MED ORDER — ONDANSETRON HCL 4 MG/2ML IJ SOLN
INTRAMUSCULAR | Status: DC | PRN
  Administered 2019-10-17: 4 mg via INTRAVENOUS

## 2019-10-17 MED ORDER — PHENYLEPHRINE HCL (PRESSORS) 10 MG/ML IV SOLN
INTRAVENOUS | Status: AC
  Filled 2019-10-17: qty 1

## 2019-10-17 MED ORDER — FENTANYL CITRATE (PF) 250 MCG/5ML IJ SOLN
INTRAMUSCULAR | Status: AC
  Filled 2019-10-17: qty 5

## 2019-10-17 MED ORDER — DEXAMETHASONE SODIUM PHOSPHATE 10 MG/ML IJ SOLN
INTRAMUSCULAR | Status: AC
  Filled 2019-10-17: qty 1

## 2019-10-17 MED ORDER — OXYCODONE HCL 5 MG PO TABS
5.0000 mg | ORAL_TABLET | ORAL | Status: DC | PRN
  Administered 2019-10-17: 5 mg via ORAL
  Filled 2019-10-17: qty 1

## 2019-10-17 MED ORDER — EPHEDRINE SULFATE-NACL 50-0.9 MG/10ML-% IV SOSY
PREFILLED_SYRINGE | INTRAVENOUS | Status: DC | PRN
  Administered 2019-10-17: 5 mg via INTRAVENOUS

## 2019-10-17 MED ORDER — LIDOCAINE 2% (20 MG/ML) 5 ML SYRINGE
INTRAMUSCULAR | Status: DC | PRN
  Administered 2019-10-17: 60 mg via INTRAVENOUS

## 2019-10-17 MED ORDER — LACTATED RINGERS IV SOLN
INTRAVENOUS | Status: DC | PRN
Start: 2019-10-17 — End: 2019-10-17

## 2019-10-17 MED ORDER — OXYCODONE HCL 5 MG PO TABS: 5.0000 mg | ORAL_TABLET | Freq: Once | ORAL | Status: DC | PRN

## 2019-10-17 MED ORDER — CEFAZOLIN SODIUM-DEXTROSE 2-3 GM-%(50ML) IV SOLR
INTRAVENOUS | Status: DC | PRN
Start: 2019-10-17 — End: 2019-10-17
  Administered 2019-10-17: 2 g via INTRAVENOUS

## 2019-10-17 MED ORDER — MIDAZOLAM HCL 2 MG/2ML IJ SOLN
INTRAMUSCULAR | Status: AC
  Filled 2019-10-17: qty 2

## 2019-10-17 MED ORDER — AMOXICILLIN-POT CLAVULANATE 875-125 MG PO TABS: 1.0000 | ORAL_TABLET | Freq: Two times a day (BID) | ORAL | 0 refills | Status: AC

## 2019-10-17 MED ORDER — ONDANSETRON 4 MG PO TBDP: 4.0000 mg | ORAL_TABLET | Freq: Four times a day (QID) | ORAL | Status: DC | PRN

## 2019-10-17 SURGICAL SUPPLY — 34 items
APPLIER CLIP 5 13 M/L LIGAMAX5 (MISCELLANEOUS)
CABLE HIGH FREQUENCY MONO STRZ (ELECTRODE) ×3 IMPLANT
CHLORAPREP W/TINT 26 (MISCELLANEOUS) ×3 IMPLANT
CLIP APPLIE 5 13 M/L LIGAMAX5 (MISCELLANEOUS) IMPLANT
CLIP VESOLOCK MED LG 6/CT (CLIP) ×3 IMPLANT
COVER MAYO STAND STRL (DRAPES) ×3 IMPLANT
COVER TRANSDUCER ULTRASND (DRAPES) IMPLANT
COVER WAND RF STERILE (DRAPES) IMPLANT
DECANTER SPIKE VIAL GLASS SM (MISCELLANEOUS) ×3 IMPLANT
DERMABOND ADVANCED (GAUZE/BANDAGES/DRESSINGS) ×2
DERMABOND ADVANCED .7 DNX12 (GAUZE/BANDAGES/DRESSINGS) ×1 IMPLANT
DRAPE C-ARM 42X120 X-RAY (DRAPES) ×3 IMPLANT
ELECT REM PT RETURN 15FT ADLT (MISCELLANEOUS) ×3 IMPLANT
GAUZE SPONGE 2X2 8PLY STRL LF (GAUZE/BANDAGES/DRESSINGS) ×1 IMPLANT
GLOVE BIO SURGEON STRL SZ7.5 (GLOVE) ×3 IMPLANT
GOWN STRL REUS W/TWL XL LVL3 (GOWN DISPOSABLE) ×9 IMPLANT
GRASPER SUT TROCAR 14GX15 (MISCELLANEOUS) IMPLANT
KIT BASIN (CUSTOM PROCEDURE TRAY) ×3 IMPLANT
KIT TURNOVER KIT A (KITS) IMPLANT
NEEDLE INSUFFLATION 14GA 120MM (NEEDLE) ×3 IMPLANT
PENCIL SMOKE EVACUATOR (MISCELLANEOUS) IMPLANT
POUCH RETRIEVAL ECOSAC 10 (ENDOMECHANICALS) ×1 IMPLANT
POUCH RETRIEVAL ECOSAC 10MM (ENDOMECHANICALS) ×3
SCISSORS LAP 5X35 DISP (ENDOMECHANICALS) ×3 IMPLANT
SET CHOLANGIOGRAPH MIX (MISCELLANEOUS) ×3 IMPLANT
SET IRRIG TUBING LAPAROSCOPIC (IRRIGATION / IRRIGATOR) ×3 IMPLANT
SET TUBE SMOKE EVAC HIGH FLOW (TUBING) ×3 IMPLANT
SLEEVE XCEL OPT CAN 5 100 (ENDOMECHANICALS) ×3 IMPLANT
SPONGE GAUZE 2X2 STER 10/PKG (GAUZE/BANDAGES/DRESSINGS) ×2
SUT MNCRL AB 4-0 PS2 18 (SUTURE) ×3 IMPLANT
TOWEL OR 17X26 10 PK STRL BLUE (TOWEL DISPOSABLE) ×3 IMPLANT
TRAY LAPAROSCOPIC (CUSTOM PROCEDURE TRAY) ×3 IMPLANT
TROCAR BLADELESS OPT 5 100 (ENDOMECHANICALS) ×3 IMPLANT
TROCAR XCEL NON-BLD 11X100MML (ENDOMECHANICALS) ×3 IMPLANT

## 2019-10-17 NOTE — Op Note (Signed)
10/17/2019  8:48 AM  PATIENT:  Ann Mcguire  69 y.o. female  PRE-OPERATIVE DIAGNOSIS:  symptomatic cholelithiasis  POST-OPERATIVE DIAGNOSIS:  Acute cholecystitis  PROCEDURE:  Procedure(s): LAPAROSCOPIC CHOLECYSTECTOMY (N/A)  SURGEON:  Surgeon(s) and Role:    Ralene Ok, MD - Primary  ANESTHESIA:   local and general  EBL:  minimal   BLOOD ADMINISTERED:none  DRAINS: none   LOCAL MEDICATIONS USED:  BUPIVICAINE   SPECIMEN:  Source of Specimen:  gallbladder  DISPOSITION OF SPECIMEN:  PATHOLOGY  COUNTS:  YES  TOURNIQUET:  * No tourniquets in log *  DICTATION: .Dragon Dictation The patient was taken to the operating and placed in the supine position with bilateral SCDs in place.  The patient was prepped and draped in the usual sterile fashion. A time out was called and all facts were verified. A pneumoperitoneum was obtained via A Veress needle technique to a pressure of 56mm of mercury.  A 92mm trochar was then placed in the right upper quadrant under visualization, and there were no injuries to any abdominal organs. A 11 mm port was then placed in the umbilical region after infiltrating with local anesthesia under direct visualization. A second and third epigastric port and right lower quadrant port placement under direct visualization, respectively.    The gallbladder was very inflamed and distended.  The bile was suctioned out and was white.  The gallbladder was then retracted, the peritoneum was then sharply dissected from the gallbladder and this dissection was carried down to Calot's triangle. The gallbladder was identified and stripped away circumferentially and seen going into the gallbladder 360, the critical angle was obtained.  2 clips were placed proximally one distally and the cystic duct transected. The cystic artery was identified and 2 clips placed proximally and one distally and transected.  We then proceeded to remove the gallbladder off the hepatic  fossa with Bovie cautery. A retrieval bag was then placed in the abdomen and gallbladder placed in the bag. The hepatic fossa was then reexamined and hemostasis was achieved with Bovie cautery and was excellent at the end of the case.   The subhepatic fossa and perihepatic fossa was then irrigated until the effluent was clear.  The gallbladder and bag were removed from the abdominal cavity. The 11 mm trocar fascia was reapproximated with the Endo Close #1 Vicryl x4.  The pneumoperitoneum was evacuated and all trochars removed under direct visulalization.  The skin was then closed with 4-0 Monocryl and the skin dressed with Dermabond.    The patient was awaken from general anesthesia and taken to the recovery room in stable condition.   PLAN OF CARE: Admit for overnight observation  PATIENT DISPOSITION:  PACU - hemodynamically stable.   Delay start of Pharmacological VTE agent (>24hrs) due to surgical blood loss or risk of bleeding: not applicable

## 2019-10-17 NOTE — Anesthesia Postprocedure Evaluation (Signed)
Anesthesia Post Note  Patient: Ann Mcguire  Procedure(s) Performed: LAPAROSCOPIC CHOLECYSTECTOMY (N/A Abdomen)     Patient location during evaluation: PACU Anesthesia Type: General Level of consciousness: awake and alert Pain management: pain level controlled Vital Signs Assessment: post-procedure vital signs reviewed and stable Respiratory status: spontaneous breathing, nonlabored ventilation, respiratory function stable and patient connected to nasal cannula oxygen Cardiovascular status: blood pressure returned to baseline and stable Postop Assessment: no apparent nausea or vomiting Anesthetic complications: no   No complications documented.  Last Vitals:  Vitals:   10/17/19 1000 10/17/19 1031  BP: (!) 147/74 (!) 150/77  Pulse: 62 70  Resp: 12 16  Temp: (!) 36.3 C (!) 36.4 C  SpO2: 97% 98%    Last Pain:  Vitals:   10/17/19 1031  TempSrc: Oral  PainSc:                  [place]

## 2019-10-17 NOTE — Progress Notes (Signed)
Day of Surgery   Subjective/Chief Complaint: PT doing well with min pain   Objective: Vital signs in last 24 hours: Temp:  [98 F (36.7 C)-99.6 F (37.6 C)] 98 F (36.7 C) (07/10 0629) Pulse Rate:  [66-71] 66 (07/10 0629) Resp:  [16-18] 18 (07/10 0629) BP: (128-143)/(67-81) 143/74 (07/10 0629) SpO2:  [64 %-100 %] 64 % (07/10 0629) Last BM Date: 10/12/19  Intake/Output from previous day: 07/09 0701 - 07/10 0700 In: 1076.2 [P.O.:650; I.V.:326.2; IV Piggyback:100] Out: 2500 [Urine:2500] Intake/Output this shift: No intake/output data recorded.  PE:  Constitutional: No acute distress, conversant, appears states age. Eyes: Anicteric sclerae, moist conjunctiva, no lid lag Lungs: Clear to auscultation bilaterally, normal respiratory effort CV: regular rate and rhythm, no murmurs, no peripheral edema, pedal pulses 2+ GI: Soft, no masses or hepatosplenomegaly, non-tender to palpation Skin: No rashes, palpation reveals normal turgor Psychiatric: appropriate judgment and insight, oriented to person, place, and time   Lab Results:  Recent Labs    10/16/19 0429 10/17/19 0509  WBC 9.9 7.3  HGB 12.2 11.7*  HCT 38.4 37.2  PLT 267 266   BMET Recent Labs    10/16/19 0429 10/17/19 0509  NA 136 135  K 3.5 3.8  CL 107 107  CO2 24 24  GLUCOSE 153* 143*  BUN 13 8  CREATININE 1.23* 1.08*  CALCIUM 8.3* 8.0*   PT/INR No results for input(s): LABPROT, INR in the last 72 hours. ABG No results for input(s): PHART, HCO3 in the last 72 hours.  Invalid input(s): PCO2, PO2  Studies/Results: NM Hepatobiliary Liver Func  Result Date: 10/15/2019 CLINICAL DATA:  Right upper quadrant abdominal pain and cholelithiasis. EXAM: NUCLEAR MEDICINE HEPATOBILIARY IMAGING TECHNIQUE: Sequential images of the abdomen were obtained out to 60 minutes following intravenous administration of radiopharmaceutical. RADIOPHARMACEUTICALS:  4.79 mCi Tc-28m  Choletec IV COMPARISON:  MRI 10/14/2019 FINDINGS:  Prompt symmetric uptake in the liver is demonstrated along with prompt excretion into the biliary tree which is visualized by 5 minutes. Activity is seen in the duodenum at 5 minutes also. Gallbladder was not observed up 60 minutes. Subsequently the patient received 3 mg of morphine sulfate IV and imaging was performed over an additional 30 minutes. The gallbladder was never visualized. IMPRESSION: Nonvisualization of the gallbladder suggesting cystic duct obstruction. The common bile duct is patent. Electronically Signed   By: Marijo Sanes M.D.   On: 10/15/2019 16:19    Anti-infectives: Anti-infectives (From admission, onward)   None      Assessment/Plan:  13 F acute acalculous cholecystitis To OR for Lap chole PLan for DC after surgery. All risks and benefits were discussed with the patient to generally include: infection, bleeding, possible need for post op ERCP, damage to the bile ducts, and bile leak. Alternatives were offered and described.  All questions were answered and the patient voiced understanding of the procedure and wishes to proceed at this point with a laparoscopic cholecystectomy    LOS: 2 days    Ralene Ok 10/17/2019

## 2019-10-17 NOTE — Discharge Instructions (Signed)
[facility] ______[facility] °LAPAROSCOPIC SURGERY: POST OP INSTRUCTIONS °Always review your discharge instruction sheet given to you by the facility where your surgery was performed. °IF YOU HAVE DISABILITY OR FAMILY LEAVE FORMS, YOU MUST BRING THEM TO THE OFFICE FOR PROCESSING.   °DO NOT GIVE THEM TO YOUR DOCTOR. ° °1. A prescription for pain medication may be given to you upon discharge.  Take your pain medication as prescribed, if needed.  If narcotic pain medicine is not needed, then you may take acetaminophen (Tylenol) or ibuprofen (Advil) as needed. °2. Take your usually prescribed medications unless otherwise directed. °3. If you need a refill on your pain medication, please contact your pharmacy.  They will contact our office to request authorization. Prescriptions will not be filled after 5pm or on week-ends. °4. You should follow a light diet the first few days after arrival home, such as soup and crackers, etc.  Be sure to include lots of fluids daily. °5. Most patients will experience some swelling and bruising in the area of the incisions.  Ice packs will help.  Swelling and bruising can take several days to resolve.  °6. It is common to experience some constipation if taking pain medication after surgery.  Increasing fluid intake and taking a stool softener (such as Colace) will usually help or prevent this problem from occurring.  A mild laxative (Milk of Magnesia or Miralax) should be taken according to package instructions if there are no bowel movements after 48 hours. °7. Unless discharge instructions indicate otherwise, you may remove your bandages 24-48 hours after surgery, and you may shower at that time.  You may have steri-strips (small skin tapes) in place directly over the incision.  These strips should be left on the skin for 7-10 days.  If your surgeon used skin glue on the incision, you may shower in 24 hours.  The glue will flake off over the next 2-3 weeks.  Any sutures or  staples will be removed at the office during your follow-up visit. °8. ACTIVITIES:  You may resume regular (light) daily activities beginning the next day--such as daily self-care, walking, climbing stairs--gradually increasing activities as tolerated.  You may have sexual intercourse when it is comfortable.  Refrain from any heavy lifting or straining until approved by your doctor. °a. You may drive when you are no longer taking prescription pain medication, you can comfortably wear a seatbelt, and you can safely maneuver your car and apply brakes. °b. RETURN TO WORK:  __________________________________________________________ °9. You should see your doctor in the office for a follow-up appointment approximately 2-3 weeks after your surgery.  Make sure that you call for this appointment within a day or two after you arrive home to insure a convenient appointment time. °10. OTHER INSTRUCTIONS: __________________________________________________________________________________________________________________________ __________________________________________________________________________________________________________________________ °WHEN TO CALL YOUR DOCTOR: °1. Fever over 101.0 °2. Inability to urinate °3. Continued bleeding from incision. °4. Increased pain, redness, or drainage from the incision. °5. Increasing abdominal pain ° °The clinic staff is available to answer your questions during regular business hours.  Please don’t hesitate to call and ask to speak to one of the nurses for clinical concerns.  If you have a medical emergency, go to the nearest emergency room or call 911.  A surgeon from [facility] is always on call at the hospital. °[address], [address], [place], [place]  [address] ? [address] [address], [place], [place]   [address] °[phone] ? [phone] ? FAX [phone] °Web site:   [email or web address] °

## 2019-10-17 NOTE — Progress Notes (Signed)
Pt transported to PACU via bed

## 2019-10-17 NOTE — Progress Notes (Signed)
Assessment unchanged. Pt walked hall, passed flatus, managed pain w/po analgesics, tolerated food, and urinated WNL. MD aware. Pt verbalized understanding of dc instructions including medications, follow up care and when to call the doctor.

## 2019-10-17 NOTE — Anesthesia Procedure Notes (Signed)
Procedure Name: Intubation Date/Time: 10/17/2019 7:51 AM Performed by: Montel Clock, CRNA Pre-anesthesia Checklist: Patient identified, Emergency Drugs available, Suction available, Patient being monitored and Timeout performed Patient Re-evaluated:Patient Re-evaluated prior to induction Oxygen Delivery Method: Circle system utilized Preoxygenation: Pre-oxygenation with 100% oxygen Induction Type: IV induction and Rapid sequence Laryngoscope Size: Mac and 3 Grade View: Grade I Tube type: Oral Tube size: 7.0 mm Number of attempts: 1 Airway Equipment and Method: Stylet Placement Confirmation: ETT inserted through vocal cords under direct vision,  positive ETCO2 and breath sounds checked- equal and bilateral Secured at: 22 cm Tube secured with: Tape Dental Injury: Teeth and Oropharynx as per pre-operative assessment

## 2019-10-17 NOTE — Discharge Summary (Signed)
Physician Discharge Summary  Ann Mcguire KGU:[id number] DOB: [birth year] DOA: 10/14/2019  PCP: Libby Maw, MD  Admit date: 10/14/2019 Discharge date: 10/17/2019  Time spent: 60 minutes  Recommendations for Outpatient Follow-up:  1. Follow-up with [facility] in 2 weeks for postop visit. 2. Follow-up with Libby Maw, MD in 2 weeks.  On follow-up patient will need a comprehensive metabolic profile done to follow-up on electrolytes and renal function.  Patient will need a CBC done to follow-up on H&H.  Patient's blood pressure need to be reassessed.  Patient will need to be set up for outpatient pelvic ultrasound for further evaluation of incidental large left ovarian cyst of 7.7 cm noted on CT abdomen and pelvis.   Discharge Diagnoses:  Principal Problem:   Acute cholecystitis Active Problems:   Symptomatic cholelithiasis   Biliary colic   Essential hypertension   Type 2 diabetes mellitus without complication, without long-term current use of insulin (HCC)   Abdominal pain   Common bile duct dilatation   Elevated LFTs   Obesity (BMI 30-39.9)   Ovarian cyst   AKI (acute kidney injury) ([place])   Discharge Condition: Stable and improved  Diet recommendation: Heart healthy  Filed Weights   10/15/19 1457  Weight: 95.7 kg    History of present illness:  HPI per Dr. Mee Hives Ann Mcguire is a 69 y.o. female with medical history significant of with a PMH of T2DM, HTN and obesity with BMI 34.06 who presented with abdominal pain.  She has completed Covid-19 vaccines.  Patient reported that she started to have progressively worsening of abdominal pain 4 days ago.  Her abdomen pain was located to RUQ abdomen area, constant and it waxed and waned pain, sharp pain, 10/10 with no radiation.  Associated symptoms included nausea but no vomiting or diarrhea.  Denies fever or chills.  Eating food aggravates the pain, and ibuprofen alleviates the pain.  Patient  did not seek medical attention until last night when she went to [facility] ED.  In the [facility] [place] ED her labs showed normal LFTs and lipase, nonrevealing CBC.  CT abdomen showed Dilation of the gallbladder with mild central biliary ductal dilatation and common bile duct dilatation. Changes of gallbladder sludge and small stones are noted. Large left ovarian cyst measuring 7.7 cm.  Ultrasound of abdomen showed Cholelithiasis and gallbladder sludge without complicating factors. Dilated common bile duct suspicious for distal stone. She was referred to [facility] ED for further evaluation.  The ED provider discussed with [facility] Dr. Michail Sermon who recommended pain control and MRCP.  Patient is currently pain-free.  Hospital Course:  1 acute cholecystitis/symptomatic cholelithiasis/biliary colic Patient presented with worsening right upper quadrant abdominal pain x4 days, nausea, pain aggravated by food.  CT abdomen and pelvis done dilation of the gallbladder with mild central biliary ductal dilatation and common bile duct dilatation.  Changes of gallbladder sludge and small stones noted.  Changes suggest distal common bile duct stone.  Large left ovarian cyst measuring 7.7 cm.  Right upper quadrant ultrasound was done consistent with cholelithiasis and gallbladder sludge without complicating factors.  Dilated common bile duct suspicious for distal stone similar to that seen on prior exam.  MRCP done which showed numerous gallstones in the gallbladder which was mildly distended, no gallbladder wall thickening or pericholecystic fluid.  Mild intrahepatic biliary ductal dilatation and dilatation of the common bile duct up to 1.2 cm without filling defect visualized to the ampulla.  Pancreatic duct mildly  prominent measuring up to 3 mm in caliber with no obstructing lesion identified.  HIDA scan with nonvisualization of the gallbladder suggesting cystic duct obstruction.  Common bile duct is patent.  Patient seen in  consultation by GI who feel patient does not need a ERCP at this time.  GI recommended evaluation by general surgery for lap cholecystectomy.  Patient seen in consultation by general surgery and patient underwent laparoscopic cholecystectomy on 10/17/2019 with findings of acute cholecystitis during surgery.  Patient remained afebrile.  Patient started on Augmentin will be discharged home on 5 days of Augmentin.  Patient cleared by general surgery for discharge if tolerating a diet.  Patient was discharged home in stable and improved condition.   2.  Acute kidney injury Likely secondary to a prerenal azotemia in the setting of HCTZ and lisinopril.  Renal function improved with hydration.    HCTZ lisinopril were held and will be resumed on discharge.  Outpatient follow-up with PCP.   3.  Hypertension HCTZ and lisinopril on hold secondary to problem #2.  Blood pressure improved and patient initially placed on Norvasc 5 mg daily for blood pressure control.  Hydralazine also ordered as needed.  Patient be discharged home back on home regimen of HCTZ and lisinopril.  Outpatient follow-up with PCP.  4.  Obesity  5.  Incidental finding of large left ovarian cyst measuring 7.7 cm Noted on CT scan.  Outpatient follow-up with PCP for ultrasound and further evaluation.  6.  Well-controlled type 2 diabetes mellitus Hemoglobin A1c of 6.4.    Patient's oral hypoglycemic agents were held and patient maintained on sliding scale insulin during the hospitalization.  Outpatient follow-up.    Procedures:  CT abdomen and pelvis 10/14/2019  Right upper quadrant ultrasound 10/14/2019  HIDA scan 10/15/2019  MRCP 10/14/2019  Consultations:  Gastroenterology: Dr. Michail Sermon 10/14/2019  General surgery: Dr. Barry Dienes 10/15/2019  Discharge Exam: Vitals:   10/17/19 1255 10/17/19 1426  BP: 126/84 129/82  Pulse: 66 71  Resp: 16 16  Temp: 97.7 F (36.5 C) (!) 97.5 F (36.4 C)  SpO2: 92% 93%    General:  NAD Cardiovascular: RRR Respiratory: CTAB  Discharge Instructions   Discharge Instructions    Diet - low sodium heart healthy   Complete by: As directed    Increase activity slowly   Complete by: As directed      Allergies as of 10/17/2019   No Known Allergies     Medication List    TAKE these medications   amoxicillin-clavulanate 875-125 MG tablet Commonly known as: AUGMENTIN Take 1 tablet by mouth every 12 (twelve) hours for 5 days.   dicyclomine 10 MG capsule Commonly known as: Bentyl Take 1 capsule (10 mg total) by mouth 4 (four) times daily -  before meals and at bedtime.   hydrochlorothiazide 25 MG tablet Commonly known as: HYDRODIURIL TAKE 1 TABLET(25 MG) BY MOUTH DAILY What changed:   how much to take  how to take this  when to take this  additional instructions   ibuprofen 200 MG tablet Commonly known as: ADVIL Take 400 mg by mouth every 6 (six) hours as needed for moderate pain.   lisinopril 20 MG tablet Commonly known as: ZESTRIL Take 1 tablet (20 mg total) by mouth daily.   metFORMIN 500 MG 24 hr tablet Commonly known as: GLUCOPHAGE-XR TAKE 1 TABLET(500 MG) BY MOUTH AT BEDTIME What changed:   how much to take  how to take this  when to take this  additional instructions   multivitamin with minerals Tabs tablet Take 1 tablet by mouth daily.   omeprazole 40 MG capsule Commonly known as: PRILOSEC Take 1 capsule (40 mg total) by mouth daily.   traMADol 50 MG tablet Commonly known as: ULTRAM Take 1 tablet (50 mg total) by mouth every 6 (six) hours as needed for moderate pain or severe pain.      No Known Allergies  Follow-up Information    [facility], [place]. Schedule an appointment as soon as possible for a visit in 2 weeks.   Specialty: General Surgery Why: Post op visit Contact information: [address] [place] [address] [phone]        Libby Maw, MD. Schedule an appointment as  soon as possible for a visit in 2 week(s).   Specialty: Family Medicine Contact information: [place] [address] [phone]                The results of significant diagnostics from this hospitalization (including imaging, microbiology, ancillary and laboratory) are listed below for reference.    Significant Diagnostic Studies: NM Hepatobiliary Liver Func  Result Date: 10/15/2019 CLINICAL DATA:  Right upper quadrant abdominal pain and cholelithiasis. EXAM: NUCLEAR MEDICINE HEPATOBILIARY IMAGING TECHNIQUE: Sequential images of the abdomen were obtained out to 60 minutes following intravenous administration of radiopharmaceutical. RADIOPHARMACEUTICALS:  4.79 mCi Tc-66m  Choletec IV COMPARISON:  MRI 10/14/2019 FINDINGS: Prompt symmetric uptake in the liver is demonstrated along with prompt excretion into the biliary tree which is visualized by 5 minutes. Activity is seen in the duodenum at 5 minutes also. Gallbladder was not observed up 60 minutes. Subsequently the patient received 3 mg of morphine sulfate IV and imaging was performed over an additional 30 minutes. The gallbladder was never visualized. IMPRESSION: Nonvisualization of the gallbladder suggesting cystic duct obstruction. The common bile duct is patent. Electronically Signed   By: Marijo Sanes M.D.   On: 10/15/2019 16:19   CT ABDOMEN PELVIS W CONTRAST  Result Date: 10/14/2019 CLINICAL DATA:  Right upper quadrant pain for several months EXAM: CT ABDOMEN AND PELVIS WITH CONTRAST TECHNIQUE: Multidetector CT imaging of the abdomen and pelvis was performed using the standard protocol following bolus administration of intravenous contrast. CONTRAST:  18mL OMNIPAQUE IOHEXOL 300 MG/ML  SOLN COMPARISON:  None. FINDINGS: Lower chest: Minimal right basilar atelectasis is noted. Hepatobiliary: Gallbladder is well distended with dependent density likely related to a combination of small gallstones and gallbladder sludge. No  definitive wall thickening is seen. Mild central intrahepatic ductal dilatation is noted. The liver is otherwise within normal limits. Common bile duct is prominent measuring up to 8 mm. This is suspicious for an underlying distal common bile duct stone although no definitive calcification is seen. Pancreas: Pancreas shows no focal mass. Very mild central dilatation of the pancreatic duct is noted which may be related to the distal common bile duct dilatation. Again this is suspicious for small distal stone. Spleen: Normal in size without focal abnormality. Adrenals/Urinary Tract: Adrenal glands are within normal limits. Kidneys are within normal limits. No renal calculi are noted. No obstructive changes are seen. The bladder is partially distended. Stomach/Bowel: The appendix is within normal limits. No obstructive or inflammatory changes of the large or small bowel are seen. Stomach is within normal limits. Vascular/Lymphatic: No significant vascular findings are present. No enlarged abdominal or pelvic lymph nodes. Reproductive: Uterus is within normal limits. Large left ovarian cyst is noted which appears  simple in nature. This measures 7.7 cm in dimension. Other: No abdominal wall hernia or abnormality. No abdominopelvic ascites. Musculoskeletal: Degenerative changes of lumbar spine are noted. IMPRESSION: Dilation of the gallbladder with mild central biliary ductal dilatation and common bile duct dilatation. Changes of gallbladder sludge and small stones are noted. These changes suggest a distal common bile duct stone. Ultrasound may be helpful for further evaluation. Large left ovarian cyst measuring 7.7 cm. Nonemergent ultrasound may be helpful to clarify the simple versus complex nature of the cyst. Electronically Signed   By: Inez Catalina M.D.   On: 10/14/2019 00:44   MR 3D Recon At Scanner  Result Date: 10/14/2019 CLINICAL DATA:  Nausea, right upper quadrant pain since Saturday, cholelithiasis EXAM: MRI  ABDOMEN WITHOUT AND WITH CONTRAST (INCLUDING MRCP) TECHNIQUE: Multiplanar multisequence MR imaging of the abdomen was performed both before and after the administration of intravenous contrast. Heavily T2-weighted images of the biliary and pancreatic ducts were obtained, and three-dimensional MRCP images were rendered by post processing. CONTRAST:  33mL GADAVIST GADOBUTROL 1 MMOL/ML IV SOLN COMPARISON:  CT abdomen pelvis, 10/14/2019, abdominal ultrasound, 10/14/2019 FINDINGS: Lower chest: No acute findings. Hepatobiliary: No mass or other parenchymal abnormality identified. Numerous gallstones in the gallbladder, which is mildly distended. No gallbladder wall thickening or pericholecystic fluid. There is mild intrahepatic biliary ductal dilatation and dilatation of the common bile duct up to 1.2 cm without filling defect visualized to the ampulla. Pancreas: No mass, inflammatory changes, or other parenchymal abnormality identified. The pancreatic duct is mildly prominent, measuring up to 3 mm in caliber. Spleen:  Within normal limits in size and appearance. Adrenals/Urinary Tract: No masses identified. No evidence of hydronephrosis. Stomach/Bowel: Visualized portions within the abdomen are unremarkable. Vascular/Lymphatic: No pathologically enlarged lymph nodes identified. No abdominal aortic aneurysm demonstrated. Other:  None. Musculoskeletal: No suspicious bone lesions identified. IMPRESSION: 1. Numerous gallstones in the gallbladder, which is mildly distended. No gallbladder wall thickening or pericholecystic fluid. 2. Mild intrahepatic biliary ductal dilatation and dilatation of the common bile duct up to 1.2 cm without filling defect visualized to the ampulla. 3. The pancreatic duct is mildly prominent, measuring up to 3 mm in caliber. Again, no obstructing lesion identified. 4. Patency of the cystic and common bile ducts may be evaluated by HIDA or ERCP if desired. Electronically Signed   By: Eddie Candle  M.D.   On: 10/14/2019 15:37   MR ABDOMEN MRCP W WO CONTAST  Result Date: 10/14/2019 CLINICAL DATA:  Nausea, right upper quadrant pain since Saturday, cholelithiasis EXAM: MRI ABDOMEN WITHOUT AND WITH CONTRAST (INCLUDING MRCP) TECHNIQUE: Multiplanar multisequence MR imaging of the abdomen was performed both before and after the administration of intravenous contrast. Heavily T2-weighted images of the biliary and pancreatic ducts were obtained, and three-dimensional MRCP images were rendered by post processing. CONTRAST:  37mL GADAVIST GADOBUTROL 1 MMOL/ML IV SOLN COMPARISON:  CT abdomen pelvis, 10/14/2019, abdominal ultrasound, 10/14/2019 FINDINGS: Lower chest: No acute findings. Hepatobiliary: No mass or other parenchymal abnormality identified. Numerous gallstones in the gallbladder, which is mildly distended. No gallbladder wall thickening or pericholecystic fluid. There is mild intrahepatic biliary ductal dilatation and dilatation of the common bile duct up to 1.2 cm without filling defect visualized to the ampulla. Pancreas: No mass, inflammatory changes, or other parenchymal abnormality identified. The pancreatic duct is mildly prominent, measuring up to 3 mm in caliber. Spleen:  Within normal limits in size and appearance. Adrenals/Urinary Tract: No masses identified. No evidence of hydronephrosis. Stomach/Bowel: Visualized portions  within the abdomen are unremarkable. Vascular/Lymphatic: No pathologically enlarged lymph nodes identified. No abdominal aortic aneurysm demonstrated. Other:  None. Musculoskeletal: No suspicious bone lesions identified. IMPRESSION: 1. Numerous gallstones in the gallbladder, which is mildly distended. No gallbladder wall thickening or pericholecystic fluid. 2. Mild intrahepatic biliary ductal dilatation and dilatation of the common bile duct up to 1.2 cm without filling defect visualized to the ampulla. 3. The pancreatic duct is mildly prominent, measuring up to 3 mm in caliber.  Again, no obstructing lesion identified. 4. Patency of the cystic and common bile ducts may be evaluated by HIDA or ERCP if desired. Electronically Signed   By: Eddie Candle M.D.   On: 10/14/2019 15:37   US Abdomen Limited RUQ  Result Date: 10/14/2019 CLINICAL DATA:  Follow-up abnormal gallbladder on recent CT examination. EXAM: ULTRASOUND ABDOMEN LIMITED RIGHT UPPER QUADRANT COMPARISON:  CT from earlier in the same day. FINDINGS: Gallbladder: Gallbladder is well distended. Gallbladder sludge and stones are noted similar to that seen on prior CT. Negative sonographic Murphy's sign is noted. No wall thickening is seen. No pericholecystic fluid is noted. Common bile duct: Diameter: 10 mm. This is consistent with that seen on recent CT and suggestive of a distal common bile duct stone. Liver: Diffusely increased in echogenicity consistent with fatty infiltration. No intrahepatic biliary ductal dilatation is seen. Portal vein is patent on color Doppler imaging with normal direction of blood flow towards the liver. Other: None. IMPRESSION: Cholelithiasis and gallbladder sludge without complicating factors. Dilated common bile duct suspicious for distal stone similar to that seen on the prior exam. Electronically Signed   By: Inez Catalina M.D.   On: 10/14/2019 03:48    Microbiology: Recent Results (from the past 240 hour(s))  SARS Coronavirus 2 by RT PCR (hospital order, performed in [facility] hospital lab) Nasopharyngeal Nasopharyngeal Swab     Status: None   Collection Time: 10/14/19  1:54 AM   Specimen: Nasopharyngeal Swab  Result Value Ref Range Status   SARS Coronavirus 2 NEGATIVE NEGATIVE Final    Comment: (NOTE) SARS-CoV-2 target nucleic acids are NOT DETECTED.  The SARS-CoV-2 RNA is generally detectable in upper and lower respiratory specimens during the acute phase of infection. The lowest concentration of SARS-CoV-2 viral copies this assay can detect is 250 copies / mL. A negative result  does not preclude SARS-CoV-2 infection and should not be used as the sole basis for treatment or other patient management decisions.  A negative result may occur with improper specimen collection / handling, submission of specimen other than nasopharyngeal swab, presence of viral mutation(s) within the areas targeted by this assay, and inadequate number of viral copies (<250 copies / mL). A negative result must be combined with clinical observations, patient history, and epidemiological information.  Fact Sheet for Patients:   StrictlyIdeas.no  Fact Sheet for Healthcare Providers: BankingDealers.co.za  This test is not yet approved or  cleared by the [place] FDA and has been authorized for detection and/or diagnosis of SARS-CoV-2 by FDA under an Emergency Use Authorization (EUA).  This EUA will remain in effect (meaning this test can be used) for the duration of the COVID-19 declaration under Section 564(b)(1) of the Act, 21 U.S.C. section 360bbb-3(b)(1), unless the authorization is terminated or revoked sooner.  Performed at [facility], [address], [place], [place] [address]   MRSA PCR Screening     Status: None   Collection Time: 10/16/19  1:04 AM   Specimen: Nasal  Mucosa; Nasopharyngeal  Result Value Ref Range Status   MRSA by PCR NEGATIVE NEGATIVE Final    Comment:        The GeneXpert MRSA Assay (FDA approved for NASAL specimens only), is one component of a comprehensive MRSA colonization surveillance program. It is not intended to diagnose MRSA infection nor to guide or monitor treatment for MRSA infections. Performed at [facility], [place] [address], [place], [place] [address]      Labs: Basic Metabolic Panel: Recent Labs  Lab 10/13/19 2135 10/14/19 1216 10/15/19 0459 10/16/19 0429 10/17/19 0509  NA 141 143 142 136 135  K 3.9 4.1 4.2 3.5 3.8  CL 105 106 107 107 107   CO2 25 24 26 24 24   GLUCOSE 106* 96 98 153* 143*  BUN 15 14 11 13 8   CREATININE 1.20* 1.26* 1.11* 1.23* 1.08*  CALCIUM 9.2 9.4 9.1 8.3* 8.0*  MG  --   --   --  1.7 2.3   Liver Function Tests: Recent Labs  Lab 10/13/19 2135 10/14/19 1216 10/15/19 0459 10/16/19 0429 10/17/19 0509  AST 21 86* 38 21 19  ALT 17 55* 40 26 25  ALKPHOS 61 70 62 53 54  BILITOT 1.0 0.8 1.0 0.9 0.6  PROT 7.8 7.2 6.7 6.2* 6.2*  ALBUMIN 4.1 3.8 3.4* 3.1* 2.9*   Recent Labs  Lab 10/13/19 2135 10/14/19 1216  LIPASE 30 36   No results for input(s): AMMONIA in the last 168 hours. CBC: Recent Labs  Lab 10/13/19 2135 10/14/19 1216 10/15/19 0459 10/16/19 0429 10/17/19 0509  WBC 5.6 5.1 7.7 9.9 7.3  NEUTROABS  --  3.1 5.5 7.6 5.1  HGB 14.0 12.9 12.9 12.2 11.7*  HCT 44.0 41.4 41.6 38.4 37.2  MCV 87.1 88.1 90.0 88.1 87.9  PLT 329 320 254 267 266   Cardiac Enzymes: No results for input(s): CKTOTAL, CKMB, CKMBINDEX, TROPONINI in the last 168 hours. BNP: BNP (last 3 results) No results for input(s): BNP in the last 8760 hours.  ProBNP (last 3 results) No results for input(s): PROBNP in the last 8760 hours.  CBG: Recent Labs  Lab 10/16/19 1128 10/16/19 1619 10/16/19 2147 10/17/19 0928 10/17/19 1152  GLUCAP 95 118* 135* 156* 143*       Signed:  Irine Seal MD.  [facility] 10/17/2019, 5:14 PM

## 2019-10-17 NOTE — Transfer of Care (Signed)
Immediate Anesthesia Transfer of Care Note  Patient: Ann Mcguire  Procedure(s) Performed: LAPAROSCOPIC CHOLECYSTECTOMY (N/A Abdomen)  Patient Location: PACU  Anesthesia Type:General  Level of Consciousness: drowsy and patient cooperative  Airway & Oxygen Therapy: Patient Spontanous Breathing and Patient connected to face mask oxygen  Post-op Assessment: Report given to RN and Post -op Vital signs reviewed and stable  Post vital signs: Reviewed and stable  Last Vitals:  Vitals Value Taken Time  BP 141/74 10/17/19 0901  Temp    Pulse 72 10/17/19 0902  Resp 14 10/17/19 0902  SpO2 95 % 10/17/19 0902  Vitals shown include unvalidated device data.  Last Pain:  Vitals:   10/17/19 0629  TempSrc: Oral  PainSc:       Patients Stated Pain Goal: 3 (14/99/69 2493)  Complications: No complications documented.

## 2019-10-17 NOTE — Anesthesia Preprocedure Evaluation (Signed)
Anesthesia Evaluation  Patient identified by MRN, date of birth, ID band Patient awake    Reviewed: Allergy & Precautions, H&P , NPO status , Patient's Chart, lab work & pertinent test results  Airway Mallampati: II   Neck ROM: full    Dental   Pulmonary neg pulmonary ROS,    breath sounds clear to auscultation       Cardiovascular hypertension,  Rhythm:regular Rate:Normal     Neuro/Psych    GI/Hepatic cholelithiasis   Endo/Other  diabetesobese  Renal/GU      Musculoskeletal   Abdominal   Peds  Hematology   Anesthesia Other Findings   Reproductive/Obstetrics                             Anesthesia Physical Anesthesia Plan  ASA: II  Anesthesia Plan: General   Post-op Pain Management:    Induction: Intravenous  PONV Risk Score and Plan: 3 and Ondansetron, Dexamethasone, Midazolam and Treatment may vary due to age or medical condition  Airway Management Planned: Oral ETT  Additional Equipment:   Intra-op Plan:   Post-operative Plan: Extubation in OR  Informed Consent: I have reviewed the patients History and Physical, chart, labs and discussed the procedure including the risks, benefits and alternatives for the proposed anesthesia with the patient or authorized representative who has indicated his/her understanding and acceptance.       Plan Discussed with: CRNA, Anesthesiologist and Surgeon  Anesthesia Plan Comments:         Anesthesia Quick Evaluation

## 2019-10-18 ENCOUNTER — Encounter (HOSPITAL_COMMUNITY): Payer: Self-pay | Admitting: General Surgery

## 2019-10-20 LAB — SURGICAL PATHOLOGY

## 2019-12-07 ENCOUNTER — Ambulatory Visit: Payer: BC Managed Care – PPO | Admitting: Gastroenterology

## 2020-01-01 DIAGNOSIS — Z1231 Encounter for screening mammogram for malignant neoplasm of breast: Secondary | ICD-10-CM | POA: Diagnosis not present

## 2020-01-01 LAB — HM MAMMOGRAPHY

## 2020-01-14 LAB — HM DIABETES EYE EXAM

## 2020-02-16 ENCOUNTER — Encounter: Payer: Self-pay | Admitting: Family Medicine

## 2020-04-15 ENCOUNTER — Ambulatory Visit: Payer: BC Managed Care – PPO | Admitting: Family Medicine

## 2020-04-18 ENCOUNTER — Other Ambulatory Visit: Payer: Self-pay | Admitting: Family Medicine

## 2020-04-18 DIAGNOSIS — I10 Essential (primary) hypertension: Secondary | ICD-10-CM

## 2020-05-20 ENCOUNTER — Ambulatory Visit: Payer: BC Managed Care – PPO | Admitting: Nurse Practitioner

## 2020-05-20 ENCOUNTER — Ambulatory Visit: Payer: BC Managed Care – PPO | Admitting: Family Medicine

## 2020-06-03 ENCOUNTER — Ambulatory Visit: Payer: BC Managed Care – PPO | Admitting: Nurse Practitioner

## 2020-06-03 ENCOUNTER — Other Ambulatory Visit: Payer: Self-pay

## 2020-06-03 ENCOUNTER — Encounter: Payer: Self-pay | Admitting: Nurse Practitioner

## 2020-06-03 VITALS — BP 122/74 | HR 65 | Temp 96.4°F | Wt 213.2 lb

## 2020-06-03 DIAGNOSIS — I10 Essential (primary) hypertension: Secondary | ICD-10-CM

## 2020-06-03 DIAGNOSIS — E559 Vitamin D deficiency, unspecified: Secondary | ICD-10-CM | POA: Diagnosis not present

## 2020-06-03 DIAGNOSIS — E119 Type 2 diabetes mellitus without complications: Secondary | ICD-10-CM

## 2020-06-03 LAB — BASIC METABOLIC PANEL
BUN: 20 mg/dL (ref 6–23)
CO2: 33 mEq/L — ABNORMAL HIGH (ref 19–32)
Calcium: 10 mg/dL (ref 8.4–10.5)
Chloride: 101 mEq/L (ref 96–112)
Creatinine, Ser: 1.18 mg/dL (ref 0.40–1.20)
GFR: 46.94 mL/min — ABNORMAL LOW (ref >60.00)
Glucose, Bld: 105 mg/dL — ABNORMAL HIGH (ref 70–99)
Potassium: 4 mEq/L (ref 3.5–5.1)
Sodium: 140 mEq/L (ref 135–145)

## 2020-06-03 LAB — HEMOGLOBIN A1C: Hgb A1c MFr Bld: 6.7 % — ABNORMAL HIGH (ref 4.6–6.5)

## 2020-06-03 MED ORDER — HYDROCHLOROTHIAZIDE 25 MG PO TABS: ORAL_TABLET | ORAL | 0 refills | Status: DC

## 2020-06-03 MED ORDER — LISINOPRIL 20 MG PO TABS: ORAL_TABLET | ORAL | 0 refills | Status: DC

## 2020-06-03 NOTE — Assessment & Plan Note (Addendum)
BP at goal with lisinopril and HCTZ BP Readings from Last 3 Encounters:  06/03/20 122/74  10/17/19 (!) 144/81  10/14/19 (!) 154/76   Repeat BMP: stable renal function Refill sent

## 2020-06-03 NOTE — Progress Notes (Signed)
Subjective:  Patient ID: Ann Mcguire, female    DOB: [birth year]  Age: 70 y.o. MRN: [id number]  CC: Follow-up (Pt is here for follow, states metformin, is making her sick, weak nauseas, no energy, )  HPI  Type 2 diabetes mellitus without complication, without long-term current use of insulin (HCC) Controlled with metformin, but it causes nausea and fatigue despite taking medication with supper. Her symptoms improved when she held medication for 3days.  Repeat HgbA1c and BMP: Stable renal function and hgbA1c at 6.7 Continue to hold metfomin F/up with pcp in 3months  Essential hypertension BP at goal with lisinopril and HCTZ BP Readings from Last 3 Encounters:  06/03/20 122/74  10/17/19 (!) 144/81  10/14/19 (!) 154/76   Repeat BMP: stable renal function Refill sent  Reviewed past Medical, Social and Family history today.  Outpatient Medications Prior to Visit  Medication Sig Dispense Refill  . ibuprofen (ADVIL) 200 MG tablet Take 400 mg by mouth every 6 (six) hours as needed for moderate pain.    . Multiple Vitamin (MULTIVITAMIN WITH MINERALS) TABS tablet Take 1 tablet by mouth daily.    . hydrochlorothiazide (HYDRODIURIL) 25 MG tablet TAKE 1 TABLET(25 MG) BY MOUTH DAILY (Patient taking differently: Take 25 mg by mouth daily.) 90 tablet 0  . lisinopril (ZESTRIL) 20 MG tablet TAKE 1 TABLET(20 MG) BY MOUTH DAILY 90 tablet 0  . metFORMIN (GLUCOPHAGE-XR) 500 MG 24 hr tablet TAKE 1 TABLET(500 MG) BY MOUTH AT BEDTIME (Patient taking differently: Take 500 mg by mouth at bedtime.) 90 tablet 0  . dicyclomine (BENTYL) 10 MG capsule Take 1 capsule (10 mg total) by mouth 4 (four) times daily -  before meals and at bedtime. (Patient not taking: Reported on 06/03/2020) 21 capsule 0  . omeprazole (PRILOSEC) 40 MG capsule Take 1 capsule (40 mg total) by mouth daily. (Patient not taking: Reported on 06/03/2020) 90 capsule 0  . traMADol (ULTRAM) 50 MG tablet Take 1 tablet (50 mg total) by mouth  every 6 (six) hours as needed for moderate pain or severe pain. (Patient not taking: Reported on 06/03/2020) 20 tablet 0   No facility-administered medications prior to visit.    ROS See HPI  Objective:  BP 122/74 (BP Location: Left Arm, Patient Position: Sitting, Cuff Size: Normal)   Pulse 65   Temp (!) 96.4 F (35.8 C) (Temporal)   Wt 213 lb 3.2 oz (96.7 kg)   SpO2 98%   BMI 34.41 kg/m   Physical Exam Vitals reviewed.  Cardiovascular:     Rate and Rhythm: Normal rate.     Pulses: Normal pulses.  Pulmonary:     Effort: Pulmonary effort is normal.  Musculoskeletal:     Right lower leg: No edema.     Left lower leg: No edema.  Neurological:     Mental Status: She is alert and oriented to person, place, and time.    Assessment & Plan:  This visit occurred during the SARS-CoV-2 public health emergency.  Safety protocols were in place, including screening questions prior to the visit, additional usage of staff PPE, and extensive cleaning of exam room while observing appropriate contact time as indicated for disinfecting solutions.   Renesmee was seen today for follow-up.  Diagnoses and all orders for this visit:  Type 2 diabetes mellitus without complication, without long-term current use of insulin (HCC) -     Hemoglobin A1c -     Basic metabolic panel  Essential hypertension -  Basic metabolic panel -     hydrochlorothiazide (HYDRODIURIL) 25 MG tablet; TAKE 1 TABLET(25 MG) BY MOUTH DAILY -     lisinopril (ZESTRIL) 20 MG tablet; TAKE 1 TABLET(20 MG) BY MOUTH DAILY  Vitamin D deficiency -     Vitamin D 1,25 dihydroxy  Stable renal function and hgbA1c at 6.7 Continue to hold metfomin F/up with pcp in 69months Problem List Items Addressed This Visit      Cardiovascular and Mediastinum   Essential hypertension    BP at goal with lisinopril and HCTZ BP Readings from Last 3 Encounters:  06/03/20 122/74  10/17/19 (!) 144/81  10/14/19 (!) 154/76   Repeat BMP:  stable renal function Refill sent      Relevant Medications   hydrochlorothiazide (HYDRODIURIL) 25 MG tablet   lisinopril (ZESTRIL) 20 MG tablet   Other Relevant Orders   Basic metabolic panel (Completed)     Endocrine   Type 2 diabetes mellitus without complication, without long-term current use of insulin (HCC) - Primary    Controlled with metformin, but it causes nausea and fatigue despite taking medication with supper. Her symptoms improved when she held medication for 3days.  Repeat HgbA1c and BMP: Stable renal function and hgbA1c at 6.7 Continue to hold metfomin F/up with pcp in 32months      Relevant Medications   lisinopril (ZESTRIL) 20 MG tablet   Other Relevant Orders   Hemoglobin A1c (Completed)   Basic metabolic panel (Completed)     Other   Vitamin D deficiency   Relevant Orders   Vitamin D 1,25 dihydroxy      Follow-up: Return in about 3 months (around 08/31/2020) for DM and HTN, hyperlipidemia with Dr. Ethelene Hal.  Wilfred Lacy, NP

## 2020-06-03 NOTE — Assessment & Plan Note (Addendum)
Controlled with metformin, but it causes nausea and fatigue despite taking medication with supper. Her symptoms improved when she held medication for 3days.  Repeat HgbA1c and BMP: Stable renal function and hgbA1c at 6.7 Continue to hold metfomin F/up with pcp in 63months

## 2020-06-03 NOTE — Patient Instructions (Signed)
Continue to hold metformin Go to lab for blood draw

## 2020-06-07 ENCOUNTER — Encounter: Payer: Self-pay | Admitting: Nurse Practitioner

## 2020-06-07 LAB — VITAMIN D 1,25 DIHYDROXY
Vitamin D 1, 25 (OH)2 Total: 54 pg/mL (ref 18–72)
Vitamin D2 1, 25 (OH)2: 11 pg/mL
Vitamin D3 1, 25 (OH)2: 43 pg/mL

## 2020-08-04 ENCOUNTER — Other Ambulatory Visit: Payer: Self-pay

## 2020-08-04 ENCOUNTER — Telehealth: Payer: Self-pay | Admitting: Family Medicine

## 2020-08-04 DIAGNOSIS — I10 Essential (primary) hypertension: Secondary | ICD-10-CM

## 2020-08-04 MED ORDER — LISINOPRIL 20 MG PO TABS: ORAL_TABLET | ORAL | 0 refills | Status: DC

## 2020-08-04 NOTE — Telephone Encounter (Signed)
What is the name of the medication? lisinopril (ZESTRIL) 20 MG tablet [[id number]]    Have you contacted your pharmacy to request a refill? Pt is needing a refill on this. She has an upcoming appointment with Dr. Ethelene Hal for a med check on 08/26/20  Which pharmacy would you like this sent to? Pharmacy  [facility], [address]  [place], [place] [address]  Phone:  [phone] Fax:  [phone]  DEA #:  [id number]      Patient notified that their request is being sent to the clinical staff for review and that they should receive a call once it is complete. If they do not receive a call within 72 hours they can check with their pharmacy or our office.

## 2020-08-04 NOTE — Telephone Encounter (Signed)
done

## 2020-08-26 ENCOUNTER — Encounter: Payer: Self-pay | Admitting: Family Medicine

## 2020-08-26 ENCOUNTER — Ambulatory Visit: Payer: BC Managed Care – PPO | Admitting: Family Medicine

## 2020-08-26 ENCOUNTER — Other Ambulatory Visit: Payer: Self-pay

## 2020-08-26 VITALS — BP 118/70 | HR 60 | Temp 97.3°F | Ht 66.0 in | Wt 207.5 lb

## 2020-08-26 DIAGNOSIS — Z0000 Encounter for general adult medical examination without abnormal findings: Secondary | ICD-10-CM | POA: Diagnosis not present

## 2020-08-26 DIAGNOSIS — E7800 Pure hypercholesterolemia, unspecified: Secondary | ICD-10-CM

## 2020-08-26 DIAGNOSIS — N1831 Chronic kidney disease, stage 3a: Secondary | ICD-10-CM

## 2020-08-26 DIAGNOSIS — E559 Vitamin D deficiency, unspecified: Secondary | ICD-10-CM | POA: Diagnosis not present

## 2020-08-26 DIAGNOSIS — E119 Type 2 diabetes mellitus without complications: Secondary | ICD-10-CM | POA: Diagnosis not present

## 2020-08-26 DIAGNOSIS — I10 Essential (primary) hypertension: Secondary | ICD-10-CM

## 2020-08-26 LAB — URINALYSIS, ROUTINE W REFLEX MICROSCOPIC
Bilirubin Urine: NEGATIVE
Ketones, ur: NEGATIVE
Nitrite: NEGATIVE
Specific Gravity, Urine: >=1.03 — AB (ref 1.000–1.030)
Total Protein, Urine: NEGATIVE
Urine Glucose: NEGATIVE
Urobilinogen, UA: 0.2 (ref 0.0–1.0)
pH: 5 (ref 5.0–8.0)

## 2020-08-26 LAB — HEMOGLOBIN A1C: Hgb A1c MFr Bld: 6.8 % — ABNORMAL HIGH (ref 4.6–6.5)

## 2020-08-26 LAB — CBC
HCT: 40.8 % (ref 36.0–46.0)
Hemoglobin: 13.5 g/dL (ref 12.0–15.0)
MCHC: 32.9 g/dL (ref 30.0–36.0)
MCV: 85.8 fl (ref 78.0–100.0)
Platelets: 330 10*3/uL (ref 150.0–400.0)
RBC: 4.76 Mil/uL (ref 3.87–5.11)
RDW: 14.7 % (ref 11.5–15.5)
WBC: 3.8 10*3/uL — ABNORMAL LOW (ref 4.0–10.5)

## 2020-08-26 LAB — MICROALBUMIN / CREATININE URINE RATIO
Creatinine,U: 167.6 mg/dL
Microalb Creat Ratio: 0.4 mg/g (ref 0.0–30.0)
Microalb, Ur: <0.7 mg/dL (ref 0.0–1.9)

## 2020-08-26 LAB — LIPID PANEL
Cholesterol: 159 mg/dL (ref 0–200)
HDL: 58.5 mg/dL (ref >39.00)
LDL Cholesterol: 88 mg/dL (ref 0–99)
NonHDL: 100.1
Total CHOL/HDL Ratio: 3
Triglycerides: 62 mg/dL (ref 0.0–149.0)
VLDL: 12.4 mg/dL (ref 0.0–40.0)

## 2020-08-26 LAB — VITAMIN D 25 HYDROXY (VIT D DEFICIENCY, FRACTURES): VITD: 29.08 ng/mL — ABNORMAL LOW (ref 30.00–100.00)

## 2020-08-26 MED ORDER — OZEMPIC (0.25 OR 0.5 MG/DOSE) 2 MG/1.5ML ~~LOC~~ SOPN: 0.5000 mg | PEN_INJECTOR | SUBCUTANEOUS | 5 refills | Status: DC

## 2020-08-26 NOTE — Patient Instructions (Signed)
Diabetes Mellitus and Foot Care Foot care is an important part of your health, especially when you have diabetes. Diabetes may cause you to have problems because of poor blood flow (circulation) to your feet and legs, which can cause your skin to:  Become thinner and drier.  Break more easily.  Heal more slowly.  Peel and crack. You may also have nerve damage (neuropathy) in your legs and feet, causing decreased feeling in them. This means that you may not notice minor injuries to your feet that could lead to more serious problems. Noticing and addressing any potential problems early is the best way to prevent future foot problems. How to care for your feet Foot hygiene  Wash your feet daily with warm water and mild soap. Do not use hot water. Then, pat your feet and the areas between your toes until they are completely dry. Do not soak your feet as this can dry your skin.  Trim your toenails straight across. Do not dig under them or around the cuticle. File the edges of your nails with an emery board or nail file.  Apply a moisturizing lotion or petroleum jelly to the skin on your feet and to dry, brittle toenails. Use lotion that does not contain alcohol and is unscented. Do not apply lotion between your toes.   Shoes and socks  Wear clean socks or stockings every day. Make sure they are not too tight. Do not wear knee-high stockings since they may decrease blood flow to your legs.  Wear shoes that fit properly and have enough cushioning. Always look in your shoes before you put them on to be sure there are no objects inside.  To break in new shoes, wear them for just a few hours a day. This prevents injuries on your feet. Wounds, scrapes, corns, and calluses  Check your feet daily for blisters, cuts, bruises, sores, and redness. If you cannot see the bottom of your feet, use a mirror or ask someone for help.  Do not cut corns or calluses or try to remove them with medicine.  If you  find a minor scrape, cut, or break in the skin on your feet, keep it and the skin around it clean and dry. You may clean these areas with mild soap and water. Do not clean the area with peroxide, alcohol, or iodine.  If you have a wound, scrape, corn, or callus on your foot, look at it several times a day to make sure it is healing and not infected. Check for: ? Redness, swelling, or pain. ? Fluid or blood. ? Warmth. ? Pus or a bad smell.   General tips  Do not cross your legs. This may decrease blood flow to your feet.  Do not use heating pads or hot water bottles on your feet. They may burn your skin. If you have lost feeling in your feet or legs, you may not know this is happening until it is too late.  Protect your feet from hot and cold by wearing shoes, such as at the beach or on hot pavement.  Schedule a complete foot exam at least once a year (annually) or more often if you have foot problems. Report any cuts, sores, or bruises to your health care provider immediately. Where to find more information  American Diabetes Association: www.diabetes.org  Association of Diabetes Care & Education Specialists: www.diabeteseducator.org Contact a health care provider if:  You have a medical condition that increases your risk of infection and   you have any cuts, sores, or bruises on your feet.  You have an injury that is not healing.  You have redness on your legs or feet.  You feel burning or tingling in your legs or feet.  You have pain or cramps in your legs and feet.  Your legs or feet are numb.  Your feet always feel cold.  You have pain around any toenails. Get help right away if:  You have a wound, scrape, corn, or callus on your foot and: ? You have pain, swelling, or redness that gets worse. ? You have fluid or blood coming from the wound, scrape, corn, or callus. ? Your wound, scrape, corn, or callus feels warm to the touch. ? You have pus or a bad smell coming from  the wound, scrape, corn, or callus. ? You have a fever. ? You have a red line going up your leg. Summary  Check your feet every day for blisters, cuts, bruises, sores, and redness.  Apply a moisturizing lotion or petroleum jelly to the skin on your feet and to dry, brittle toenails.  Wear shoes that fit properly and have enough cushioning.  If you have foot problems, report any cuts, sores, or bruises to your health care provider immediately.  Schedule a complete foot exam at least once a year (annually) or more often if you have foot problems. This information is not intended to replace advice given to you by your health care provider. Make sure you discuss any questions you have with your health care provider. Document Revised: 10/15/2019 Document Reviewed: 10/15/2019 Elsevier Patient Education  [place] injection solution What is this medicine? SEMAGLUTIDE (Sem a GLOO tide) is used to improve blood sugar control in adults with type 2 diabetes. This medicine may be used with other diabetes medicines. This drug may also reduce the risk of heart attack or stroke if you have type 2 diabetes and risk factors for heart disease. This medicine may be used for other purposes; ask your health care provider or pharmacist if you have questions. COMMON BRAND NAME(S): OZEMPIC What should I tell my health care provider before I take this medicine? They need to know if you have any of these conditions:  endocrine tumors (MEN 2) or if someone in your family had these tumors  eye disease, vision problems  history of pancreatitis  kidney disease  stomach problems  thyroid cancer or if someone in your family had thyroid cancer  an unusual or allergic reaction to semaglutide, other medicines, foods, dyes, or preservatives  pregnant or trying to get pregnant  breast-feeding How should I use this medicine? This medicine is for injection under the skin of your upper leg  (thigh), stomach area, or upper arm. It is given once every week (every 7 days). You will be taught how to prepare and give this medicine. Use exactly as directed. Take your medicine at regular intervals. Do not take it more often than directed. If you use this medicine with insulin, you should inject this medicine and the insulin separately. Do not mix them together. Do not give the injections right next to each other. Change (rotate) injection sites with each injection. It is important that you put your used needles and syringes in a special sharps container. Do not put them in a trash can. If you do not have a sharps container, call your pharmacist or healthcare provider to get one. A special MedGuide will be given to you by the  pharmacist with each prescription and refill. Be sure to read this information carefully each time. This drug comes with INSTRUCTIONS FOR USE. Ask your pharmacist for directions on how to use this drug. Read the information carefully. Talk to your pharmacist or health care provider if you have questions. Talk to your pediatrician regarding the use of this medicine in children. Special care may be needed. Overdosage: If you think you have taken too much of this medicine contact a poison control center or emergency room at once. NOTE: This medicine is only for you. Do not share this medicine with others. What if I miss a dose? If you miss a dose, take it as soon as you can within 5 days after the missed dose. Then take your next dose at your regular weekly time. If it has been longer than 5 days after the missed dose, do not take the missed dose. Take the next dose at your regular time. Do not take double or extra doses. If you have questions about a missed dose, contact your health care provider for advice. What may interact with this medicine?  other medicines for diabetes Many medications may cause changes in blood sugar, these include:  alcohol containing  beverages  antiviral medicines for HIV or AIDS  aspirin and aspirin-like drugs  certain medicines for blood pressure, heart disease, irregular heart beat  chromium  diuretics  female hormones, such as estrogens or progestins, birth control pills  fenofibrate  gemfibrozil  isoniazid  lanreotide  female hormones or anabolic steroids  MAOIs like Carbex, Eldepryl, Marplan, Nardil, and Parnate  medicines for weight loss  medicines for allergies, asthma, cold, or cough  medicines for depression, anxiety, or psychotic disturbances  niacin  nicotine  NSAIDs, medicines for pain and inflammation, like ibuprofen or naproxen  octreotide  pasireotide  pentamidine  phenytoin  probenecid  quinolone antibiotics such as ciprofloxacin, levofloxacin, ofloxacin  some herbal dietary supplements  steroid medicines such as prednisone or cortisone  sulfamethoxazole; trimethoprim  thyroid hormones Some medications can hide the warning symptoms of low blood sugar (hypoglycemia). You may need to monitor your blood sugar more closely if you are taking one of these medications. These include:  beta-blockers, often used for high blood pressure or heart problems (examples include atenolol, metoprolol, propranolol)  clonidine  guanethidine  reserpine This list may not describe all possible interactions. Give your health care provider a list of all the medicines, herbs, non-prescription drugs, or dietary supplements you use. Also tell them if you smoke, drink alcohol, or use illegal drugs. Some items may interact with your medicine. What should I watch for while using this medicine? Visit your doctor or health care professional for regular checks on your progress. Drink plenty of fluids while taking this medicine. Check with your doctor or health care professional if you get an attack of severe diarrhea, nausea, and vomiting. The loss of too much body fluid can make it dangerous for  you to take this medicine. A test called the HbA1C (A1C) will be monitored. This is a simple blood test. It measures your blood sugar control over the last 2 to 3 months. You will receive this test every 3 to 6 months. Learn how to check your blood sugar. Learn the symptoms of low and high blood sugar and how to manage them. Always carry a quick-source of sugar with you in case you have symptoms of low blood sugar. Examples include hard sugar candy or glucose tablets. Make sure others know  that you can choke if you eat or drink when you develop serious symptoms of low blood sugar, such as seizures or unconsciousness. They must get medical help at once. Tell your doctor or health care professional if you have high blood sugar. You might need to change the dose of your medicine. If you are sick or exercising more than usual, you might need to change the dose of your medicine. Do not skip meals. Ask your doctor or health care professional if you should avoid alcohol. Many nonprescription cough and cold products contain sugar or alcohol. These can affect blood sugar. Pens should never be shared. Even if the needle is changed, sharing may result in passing of viruses like hepatitis or HIV. Wear a medical ID bracelet or chain, and carry a card that describes your disease and details of your medicine and dosage times. Do not become pregnant while taking this medicine. Women should inform their doctor if they wish to become pregnant or think they might be pregnant. There is a potential for serious side effects to an unborn child. Talk to your health care professional or pharmacist for more information. What side effects may I notice from receiving this medicine? Side effects that you should report to your doctor or health care professional as soon as possible:  allergic reactions like skin rash, itching or hives, swelling of the face, lips, or tongue  breathing problems  changes in vision  diarrhea that  continues or is severe  lump or swelling on the neck  severe nausea  signs and symptoms of infection like fever or chills; cough; sore throat; pain or trouble passing urine  signs and symptoms of low blood sugar such as feeling anxious, confusion, dizziness, increased hunger, unusually weak or tired, sweating, shakiness, cold, irritable, headache, blurred vision, fast heartbeat, loss of consciousness  signs and symptoms of kidney injury like trouble passing urine or change in the amount of urine  trouble swallowing  unusual stomach upset or pain  vomiting Side effects that usually do not require medical attention (report to your doctor or health care professional if they continue or are bothersome):  constipation  diarrhea  nausea  pain, redness, or irritation at site where injected  stomach upset This list may not describe all possible side effects. Call your doctor for medical advice about side effects. You may report side effects to FDA at 1-800-FDA-1088. Where should I keep my medicine? Keep out of the reach of children. Store unopened pens in a refrigerator between 2 and 8 degrees C (36 and 46 degrees F). Do not freeze. Protect from light and heat. After you first use the pen, it can be stored for 56 days at room temperature between 15 and 30 degrees C (59 and 86 degrees F) or in a refrigerator. Throw away your used pen after 56 days or after the expiration date, whichever comes first. Do not store your pen with the needle attached. If the needle is left on, medicine may leak from the pen. NOTE: This sheet is a summary. It may not cover all possible information. If you have questions about this medicine, talk to your doctor, pharmacist, or health care provider.  2021 Elsevier/Gold Standard (2018-12-09 09:41:51)

## 2020-08-26 NOTE — Progress Notes (Signed)
Established Patient Office Visit  Subjective:  Patient ID: Ann Mcguire, female    DOB: 08-Jul-1950  Age: 70 y.o. MRN: [id number]  CC:  Chief Complaint  Patient presents with  . Hypertension    HPI Ann Mcguire presents for follow-up of diabetes, hypertension, CKD.  Doing well.  Continues to work.  Ann Mcguire has not tolerated metformin in the past due to to GI side effects.  Up-to-date on female care.  Needs colonoscopy.  Past Medical History:  Diagnosis Date  . Chicken pox   . Fainting spell   . Hypertension     Past Surgical History:  Procedure Laterality Date  . CHOLECYSTECTOMY N/A 10/17/2019   Procedure: LAPAROSCOPIC CHOLECYSTECTOMY;  Surgeon: Ralene Ok, MD;  Location: [facility];  Service: General;  Laterality: N/A;    Family History  Problem Relation Age of Onset  . Hypertension Sister     Social History   Socioeconomic History  . Marital status: Divorced    Spouse name: Not on file  . Number of children: Not on file  . Years of education: Not on file  . Highest education level: Not on file  Occupational History  . Not on file  Tobacco Use  . Smoking status: Never Smoker  . Smokeless tobacco: Never Used  Substance and Sexual Activity  . Alcohol use: No  . Drug use: No  . Sexual activity: Not on file  Other Topics Concern  . Not on file  Social History Narrative  . Not on file   Social Determinants of Health   Financial Resource Strain: Not on file  Food Insecurity: Not on file  Transportation Needs: Not on file  Physical Activity: Not on file  Stress: Not on file  Social Connections: Not on file  Intimate Partner Violence: Not on file    Outpatient Medications Prior to Visit  Medication Sig Dispense Refill  . hydrochlorothiazide (HYDRODIURIL) 25 MG tablet TAKE 1 TABLET(25 MG) BY MOUTH DAILY 90 tablet 0  . ibuprofen (ADVIL) 200 MG tablet Take 400 mg by mouth every 6 (six) hours as needed for moderate pain.    Marland Kitchen lisinopril (ZESTRIL) 20 MG  tablet TAKE 1 TABLET(20 MG) BY MOUTH DAILY 90 tablet 0  . Multiple Vitamin (MULTIVITAMIN WITH MINERALS) TABS tablet Take 1 tablet by mouth daily.     No facility-administered medications prior to visit.    No Known Allergies  ROS Review of Systems  Constitutional: Negative.   HENT: Negative.   Eyes: Negative for photophobia and visual disturbance.  Respiratory: Negative.   Cardiovascular: Negative.   Gastrointestinal: Negative.   Endocrine: Negative for polyphagia and polyuria.  Genitourinary: Negative.   Musculoskeletal: Negative for arthralgias and myalgias.  Skin: Negative for color change and pallor.  Neurological: Negative for speech difficulty and weakness.  Psychiatric/Behavioral: Negative.       Objective:    Physical Exam Vitals and nursing note reviewed.  Constitutional:      General: Ann Mcguire is not in acute distress.    Appearance: Normal appearance. Ann Mcguire is obese. Ann Mcguire is not ill-appearing, toxic-appearing or diaphoretic.  HENT:     Head: Normocephalic and atraumatic.     Right Ear: Tympanic membrane, ear canal and external ear normal.     Left Ear: Tympanic membrane, ear canal and external ear normal.     Mouth/Throat:     Mouth: Mucous membranes are dry.     Pharynx: Oropharynx is clear.  Eyes:     General: No scleral  icterus.       Right eye: No discharge.        Left eye: No discharge.     Extraocular Movements: Extraocular movements intact.     Conjunctiva/sclera: Conjunctivae normal.     Pupils: Pupils are equal, round, and reactive to light.  Neck:     Vascular: No carotid bruit.  Cardiovascular:     Rate and Rhythm: Normal rate and regular rhythm.  Pulmonary:     Effort: Pulmonary effort is normal.     Breath sounds: Normal breath sounds.  Abdominal:     General: Bowel sounds are normal.  Musculoskeletal:     Cervical back: No rigidity or tenderness.  Lymphadenopathy:     Cervical: No cervical adenopathy.  Skin:    General: Skin is warm and  dry.  Neurological:     Mental Status: Ann Mcguire is alert and oriented to person, place, and time.  Psychiatric:        Mood and Affect: Mood normal.        Behavior: Behavior normal.    Diabetic Foot Exam - Simple   Simple Foot Form Diabetic Foot exam was performed with the following findings: Yes 08/26/2020  9:33 AM  Visual Inspection See comments: Yes Sensation Testing Intact to touch and monofilament testing bilaterally: Yes Pulse Check Posterior Tibialis and Dorsalis pulse intact bilaterally: Yes Comments Feet are cavus bilaterally.    BP 118/70 (BP Location: Left Arm, Patient Position: Sitting, Cuff Size: Large)   Pulse 60   Temp (!) 97.3 F (36.3 C) (Temporal)   Ht 5\' 6"  (1.676 m)   Wt 207 lb 8 oz (94.1 kg)   BMI 33.49 kg/m  Wt Readings from Last 3 Encounters:  08/26/20 207 lb 8 oz (94.1 kg)  06/03/20 213 lb 3.2 oz (96.7 kg)  10/15/19 210 lb 15.7 oz (95.7 kg)     Health Maintenance Due  Topic Date Due  . COLONOSCOPY (Pts 45-85yrs Insurance coverage will need to be confirmed)  04/20/2015  . COVID-19 Vaccine (3 - Booster for Pfizer series) 11/27/2019    There are no preventive care reminders to display for this patient.  Lab Results  Component Value Date   TSH 1.985 10/14/2019   Lab Results  Component Value Date   WBC 7.3 10/17/2019   HGB 11.7 (L) 10/17/2019   HCT 37.2 10/17/2019   MCV 87.9 10/17/2019   PLT 266 10/17/2019   Lab Results  Component Value Date   NA 140 06/03/2020   K 4.0 06/03/2020   CO2 33 (H) 06/03/2020   GLUCOSE 105 (H) 06/03/2020   BUN 20 06/03/2020   CREATININE 1.18 06/03/2020   BILITOT 0.6 10/17/2019   ALKPHOS 54 10/17/2019   AST 19 10/17/2019   ALT 25 10/17/2019   PROT 6.2 (L) 10/17/2019   ALBUMIN 2.9 (L) 10/17/2019   CALCIUM 10.0 06/03/2020   ANIONGAP 4 (L) 10/17/2019   GFR 46.94 (L) 06/03/2020   Lab Results  Component Value Date   CHOL 151 10/02/2019   Lab Results  Component Value Date   HDL 52.00 10/02/2019   Lab  Results  Component Value Date   LDLCALC 88 10/02/2019   Lab Results  Component Value Date   TRIG 54.0 10/02/2019   Lab Results  Component Value Date   CHOLHDL 3 10/02/2019   Lab Results  Component Value Date   HGBA1C 6.7 (H) 06/03/2020      Assessment & Plan:   Problem List Items Addressed This  Visit      Cardiovascular and Mediastinum   Essential hypertension   Relevant Orders   CBC   Urinalysis, Routine w reflex microscopic   Microalbumin / creatinine urine ratio   Comprehensive metabolic panel     Endocrine   Type 2 diabetes mellitus without complication, without long-term current use of insulin (HCC) - Primary   Relevant Medications   Semaglutide,0.25 or 0.5MG /DOS, (OZEMPIC, 0.25 OR 0.5 MG/DOSE,) 2 MG/1.5ML SOPN   Other Relevant Orders   Hemoglobin A1c   Urinalysis, Routine w reflex microscopic   Microalbumin / creatinine urine ratio   Comprehensive metabolic panel     Other   Vitamin D deficiency   Relevant Orders   VITAMIN D 25 Hydroxy (Vit-D Deficiency, Fractures)    Other Visit Diagnoses    Elevated cholesterol       Relevant Orders   Lipid panel   Urinalysis, Routine w reflex microscopic   Comprehensive metabolic panel   Healthcare maintenance       Relevant Orders   Ambulatory referral to Gastroenterology   Stage 3a chronic kidney disease ([place])       Relevant Orders   Comprehensive metabolic panel      Meds ordered this encounter  Medications  . Semaglutide,0.25 or 0.5MG /DOS, (OZEMPIC, 0.25 OR 0.5 MG/DOSE,) 2 MG/1.5ML SOPN    Sig: Inject 0.5 mg into the skin once a week.    Dispense:  1.5 mL    Refill:  5    Follow-up: Return in about 3 months (around 11/26/2020).   Continue current medications for hypertension.  Have added Ozempic 0.5 mg weekly.  Ann Mcguire will teach Ann how to inject it.  Given information on diabetes and foot care.  Advised Ann to use shoes with good arch support.  We discussed Ann kidney function and advised the  importance of copious hydration.  We will continue to follow this issue. Libby Maw, MD

## 2020-10-26 ENCOUNTER — Other Ambulatory Visit: Payer: Self-pay | Admitting: Nurse Practitioner

## 2020-10-26 DIAGNOSIS — I10 Essential (primary) hypertension: Secondary | ICD-10-CM

## 2020-10-26 NOTE — Telephone Encounter (Signed)
Chart supports Rx Last OV 08/2020 No future appointment scheduled

## 2020-12-23 ENCOUNTER — Other Ambulatory Visit: Payer: Self-pay | Admitting: Family Medicine

## 2020-12-23 DIAGNOSIS — E119 Type 2 diabetes mellitus without complications: Secondary | ICD-10-CM

## 2020-12-29 ENCOUNTER — Telehealth: Payer: Self-pay | Admitting: Family Medicine

## 2020-12-29 DIAGNOSIS — E119 Type 2 diabetes mellitus without complications: Secondary | ICD-10-CM

## 2020-12-29 NOTE — Telephone Encounter (Signed)
Pt would like to renew her metFORMIN (GLUCOPHAGE-XR) 500 MG 24 hr tablet [Pharmacy Med Name: METFORMIN ER 500MG  24HR TABS] [[id number] prescription [address]  [place], [place] [address]  Phone:  [phone]  Fax:  [phone]  DEA #:  [id number]

## 2020-12-29 NOTE — Telephone Encounter (Signed)
Patient requesting refill on Metformin chart shows that Metformin was discontinued by Wilfred Lacy 06/03/20 due to allergy/nausea and fatigue to medication. Please advise

## 2020-12-29 NOTE — Telephone Encounter (Signed)
Called patient to schedule appointment no answer LMTCB

## 2020-12-30 ENCOUNTER — Other Ambulatory Visit: Payer: Self-pay | Admitting: Family Medicine

## 2020-12-30 DIAGNOSIS — E119 Type 2 diabetes mellitus without complications: Secondary | ICD-10-CM

## 2020-12-30 MED ORDER — METFORMIN HCL ER 500 MG PO TB24: ORAL_TABLET | ORAL | 1 refills | Status: DC

## 2020-12-30 NOTE — Telephone Encounter (Signed)
Pt called to f/u on RX request and said she missed a call. Advised pt of below and scheduled next avail OV with Dr. Ethelene Hal 01/13/2021. Pt is now out of metformin stating she never discontinued it. She said that sometimes it does make her nauseous or tired but her daughter (a Marine scientist at [employer]) told her that is common with this type of med so she did not discontinue use.   Can med be filled until appt? Pt requesting call back.  Ph# [phone]

## 2020-12-30 NOTE — Addendum Note (Signed)
Addended by: Jon Billings on: 12/30/2020 02:25 PM   Modules accepted: Orders

## 2021-01-06 DIAGNOSIS — Z1231 Encounter for screening mammogram for malignant neoplasm of breast: Secondary | ICD-10-CM | POA: Diagnosis not present

## 2021-01-06 LAB — HM MAMMOGRAPHY

## 2021-01-07 IMAGING — NM NM HEPATOBILIARY IMAGE, INC GB
2 series · 12 of 12 positions shown · non-contrast
Comparison: MRI 10/14/2019

CLINICAL DATA: Right upper quadrant abdominal pain and
cholelithiasis.

EXAM:
NUCLEAR MEDICINE HEPATOBILIARY IMAGING
TECHNIQUE: Sequential images of the abdomen were obtained [DATE] minutes
following intravenous administration of radiopharmaceutical.
RADIOPHARMACEUTICALS:  4.79 mCi 6c-99m  Choletec IV

[Series 1: biliary · 3.25mm/px · 6 of 60 frames shown (1 of 2)]
[frame 6/60]
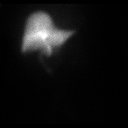
[frame 16/60]
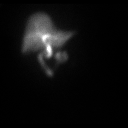
[frame 26/60]
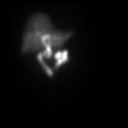
[frame 36/60]
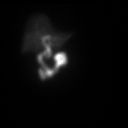
[frame 46/60]
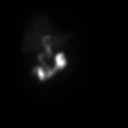
[frame 56/60]
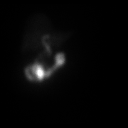

[Series 2: biliary · 3.25mm/px · 6 of 30 frames shown (2 of 2)]
[frame 3/30]
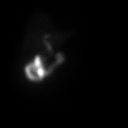
[frame 8/30]
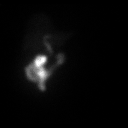
[frame 13/30]
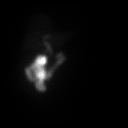
[frame 18/30]
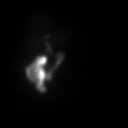
[frame 23/30]
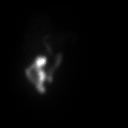
[frame 28/30]
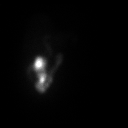

[12 of 12 positions shown; findings below may reference images not displayed]

FINDINGS: Prompt symmetric uptake in the liver is demonstrated along with
prompt excretion into the biliary tree which is visualized by 5
minutes. Activity is seen in the duodenum at 5 minutes also.
Gallbladder was not observed up 60 minutes. Subsequently the patient
received 3 mg of morphine sulfate IV and imaging was performed over
an additional 30 minutes. The gallbladder was never visualized.
IMPRESSION: Nonvisualization of the gallbladder suggesting cystic duct
obstruction.

The common bile duct is patent.

## 2021-01-10 ENCOUNTER — Encounter: Payer: Self-pay | Admitting: Family Medicine

## 2021-01-13 ENCOUNTER — Ambulatory Visit: Payer: BC Managed Care – PPO | Admitting: Family Medicine

## 2021-02-09 ENCOUNTER — Other Ambulatory Visit: Payer: Self-pay | Admitting: Family Medicine

## 2021-02-09 DIAGNOSIS — I10 Essential (primary) hypertension: Secondary | ICD-10-CM

## 2021-02-22 ENCOUNTER — Other Ambulatory Visit: Payer: Self-pay

## 2021-02-22 ENCOUNTER — Ambulatory Visit: Payer: BC Managed Care – PPO | Admitting: Family Medicine

## 2021-02-22 ENCOUNTER — Encounter: Payer: Self-pay | Admitting: Family Medicine

## 2021-02-22 VITALS — BP 136/78 | HR 59 | Temp 97.5°F | Ht 66.0 in | Wt 210.2 lb

## 2021-02-22 DIAGNOSIS — E7800 Pure hypercholesterolemia, unspecified: Secondary | ICD-10-CM | POA: Diagnosis not present

## 2021-02-22 DIAGNOSIS — E559 Vitamin D deficiency, unspecified: Secondary | ICD-10-CM | POA: Diagnosis not present

## 2021-02-22 DIAGNOSIS — I10 Essential (primary) hypertension: Secondary | ICD-10-CM | POA: Diagnosis not present

## 2021-02-22 DIAGNOSIS — Z0000 Encounter for general adult medical examination without abnormal findings: Secondary | ICD-10-CM

## 2021-02-22 DIAGNOSIS — E119 Type 2 diabetes mellitus without complications: Secondary | ICD-10-CM | POA: Diagnosis not present

## 2021-02-22 MED ORDER — METFORMIN HCL ER 500 MG PO TB24: ORAL_TABLET | ORAL | 0 refills | Status: DC

## 2021-02-22 NOTE — Progress Notes (Signed)
Established Patient Office Visit  Subjective:  Patient ID: Ann Mcguire, female    DOB: 10/01/1950  Age: 70 y.o. MRN: [id number]  CC:  Chief Complaint  Patient presents with   Follow-up    Refill/follow up on diabetes, no concerns.     HPI Ann Mcguire presents for follow-up of diabetes, hyperlipidemia, hypertension and health maintenance.  Did not go for colonoscopy consultation.  Did not start semaglutide.  Was scheduled for follow-up in August.  Has been out of her metformin.  Past Medical History:  Diagnosis Date   Chicken pox    Fainting spell    Hypertension     Past Surgical History:  Procedure Laterality Date   CHOLECYSTECTOMY N/A 10/17/2019   Procedure: LAPAROSCOPIC CHOLECYSTECTOMY;  Surgeon: Ralene Ok, MD;  Location: [facility];  Service: General;  Laterality: N/A;    Family History  Problem Relation Age of Onset   Hypertension Sister     Social History   Socioeconomic History   Marital status: Divorced    Spouse name: Not on file   Number of children: Not on file   Years of education: Not on file   Highest education level: Not on file  Occupational History   Not on file  Tobacco Use   Smoking status: Never   Smokeless tobacco: Never  Substance and Sexual Activity   Alcohol use: No   Drug use: No   Sexual activity: Not on file  Other Topics Concern   Not on file  Social History Narrative   Not on file   Social Determinants of Health   Financial Resource Strain: Not on file  Food Insecurity: Not on file  Transportation Needs: Not on file  Physical Activity: Not on file  Stress: Not on file  Social Connections: Not on file  Intimate Partner Violence: Not on file    Outpatient Medications Prior to Visit  Medication Sig Dispense Refill   hydrochlorothiazide (HYDRODIURIL) 25 MG tablet TAKE 1 TABLET(25 MG) BY MOUTH DAILY 90 tablet 0   ibuprofen (ADVIL) 200 MG tablet Take 400 mg by mouth every 6 (six) hours as needed for moderate  pain.     lisinopril (ZESTRIL) 20 MG tablet TAKE 1 TABLET(20 MG) BY MOUTH DAILY 90 tablet 0   Multiple Vitamin (MULTIVITAMIN WITH MINERALS) TABS tablet Take 1 tablet by mouth daily.     Semaglutide,0.25 or 0.5MG /DOS, (OZEMPIC, 0.25 OR 0.5 MG/DOSE,) 2 MG/1.5ML SOPN Inject 0.5 mg into the skin once a week. 1.5 mL 5   metFORMIN (GLUCOPHAGE XR) 500 MG 24 hr tablet Take one at night prior to bed. (Patient not taking: Reported on 02/22/2021) 30 tablet 1   No facility-administered medications prior to visit.    No Known Allergies  ROS Review of Systems  Constitutional:  Negative for diaphoresis, fatigue, fever and unexpected weight change.  HENT: Negative.    Respiratory: Negative.    Cardiovascular: Negative.   Gastrointestinal: Negative.   Endocrine: Negative for polyphagia and polyuria.  Genitourinary: Negative.   Neurological:  Negative for speech difficulty and weakness.  Psychiatric/Behavioral: Negative.       Objective:    Physical Exam Vitals and nursing note reviewed.  Constitutional:      General: She is not in acute distress.    Appearance: Normal appearance. She is not ill-appearing, toxic-appearing or diaphoretic.  HENT:     Head: Normocephalic and atraumatic.     Right Ear: Tympanic membrane, ear canal and external ear normal.  Left Ear: Tympanic membrane, ear canal and external ear normal.     Mouth/Throat:     Mouth: Mucous membranes are moist.     Pharynx: Oropharynx is clear. No oropharyngeal exudate or posterior oropharyngeal erythema.  Eyes:     General: No scleral icterus.       Right eye: No discharge.        Left eye: No discharge.     Extraocular Movements: Extraocular movements intact.     Conjunctiva/sclera: Conjunctivae normal.     Pupils: Pupils are equal, round, and reactive to light.  Cardiovascular:     Rate and Rhythm: Normal rate and regular rhythm.  Pulmonary:     Breath sounds: Normal breath sounds.  Abdominal:     General: Bowel sounds  are normal.  Musculoskeletal:     Cervical back: No rigidity or tenderness.  Lymphadenopathy:     Cervical: No cervical adenopathy.  Skin:    General: Skin is warm and dry.  Neurological:     Mental Status: She is alert and oriented to person, place, and time.  Psychiatric:        Mood and Affect: Mood normal.        Behavior: Behavior normal.    BP 136/78 (BP Location: Left Arm, Patient Position: Sitting, Cuff Size: Large)   Pulse (!) 59   Temp (!) 97.5 F (36.4 C) (Temporal)   Ht 5\' 6"  (1.676 m)   Wt 210 lb 3.2 oz (95.3 kg)   SpO2 94%   BMI 33.93 kg/m  Wt Readings from Last 3 Encounters:  02/22/21 210 lb 3.2 oz (95.3 kg)  08/26/20 207 lb 8 oz (94.1 kg)  06/03/20 213 lb 3.2 oz (96.7 kg)     Health Maintenance Due  Topic Date Due   Pneumonia Vaccine 52+ Years old (1 - PCV) Never done   Zoster Vaccines- Shingrix (1 of 2) Never done   COLONOSCOPY (Pts 45-57yrs Insurance coverage will need to be confirmed)  04/20/2015   OPHTHALMOLOGY EXAM  01/13/2021    There are no preventive care reminders to display for this patient.  Lab Results  Component Value Date   TSH 1.985 10/14/2019   Lab Results  Component Value Date   WBC 3.8 (L) 08/26/2020   HGB 13.5 08/26/2020   HCT 40.8 08/26/2020   MCV 85.8 08/26/2020   PLT 330.0 08/26/2020   Lab Results  Component Value Date   NA 140 06/03/2020   K 4.0 06/03/2020   CO2 33 (H) 06/03/2020   GLUCOSE 105 (H) 06/03/2020   BUN 20 06/03/2020   CREATININE 1.18 06/03/2020   BILITOT 0.6 10/17/2019   ALKPHOS 54 10/17/2019   AST 19 10/17/2019   ALT 25 10/17/2019   PROT 6.2 (L) 10/17/2019   ALBUMIN 2.9 (L) 10/17/2019   CALCIUM 10.0 06/03/2020   ANIONGAP 4 (L) 10/17/2019   GFR 46.94 (L) 06/03/2020   Lab Results  Component Value Date   CHOL 159 08/26/2020   Lab Results  Component Value Date   HDL 58.50 08/26/2020   Lab Results  Component Value Date   LDLCALC 88 08/26/2020   Lab Results  Component Value Date   TRIG  62.0 08/26/2020   Lab Results  Component Value Date   CHOLHDL 3 08/26/2020   Lab Results  Component Value Date   HGBA1C 6.8 (H) 08/26/2020      Assessment & Plan:   Problem List Items Addressed This Visit  Cardiovascular and Mediastinum   Essential hypertension   Relevant Orders   CBC   Comprehensive metabolic panel   Urinalysis, Routine w reflex microscopic     Endocrine   Type 2 diabetes mellitus without complication, without long-term current use of insulin (HCC) - Primary   Relevant Medications   metFORMIN (GLUCOPHAGE XR) 500 MG 24 hr tablet   Other Relevant Orders   Comprehensive metabolic panel   Hemoglobin A1c   Urinalysis, Routine w reflex microscopic     Other   Vitamin D deficiency   Relevant Orders   VITAMIN D 25 Hydroxy (Vit-D Deficiency, Fractures)   Other Visit Diagnoses     Elevated cholesterol       Relevant Orders   Lipid panel   Healthcare maintenance       Relevant Orders   Ambulatory referral to Gastroenterology       Meds ordered this encounter  Medications   metFORMIN (GLUCOPHAGE XR) 500 MG 24 hr tablet    Sig: Take one at night prior to bed.    Dispense:  90 tablet    Refill:  0    Follow-up: Return in about 3 months (around 05/25/2021), or Return fasting for blood work next few days..  Will return fasting for above ordered blood work in the next few days and again in 3 months.  Restart metformin.  COVID-vaccine and flu vaccine per pharmacy.  We are referring her to GI for colonoscopy.  Eye exam will be after the first of the year.  Libby Maw, MD

## 2021-02-24 ENCOUNTER — Ambulatory Visit: Payer: BC Managed Care – PPO | Admitting: Family Medicine

## 2021-02-28 ENCOUNTER — Other Ambulatory Visit (INDEPENDENT_AMBULATORY_CARE_PROVIDER_SITE_OTHER): Payer: BC Managed Care – PPO

## 2021-02-28 ENCOUNTER — Other Ambulatory Visit: Payer: Self-pay

## 2021-02-28 DIAGNOSIS — E559 Vitamin D deficiency, unspecified: Secondary | ICD-10-CM

## 2021-02-28 DIAGNOSIS — E7800 Pure hypercholesterolemia, unspecified: Secondary | ICD-10-CM

## 2021-02-28 DIAGNOSIS — I10 Essential (primary) hypertension: Secondary | ICD-10-CM | POA: Diagnosis not present

## 2021-02-28 DIAGNOSIS — E119 Type 2 diabetes mellitus without complications: Secondary | ICD-10-CM | POA: Diagnosis not present

## 2021-02-28 LAB — COMPREHENSIVE METABOLIC PANEL
ALT: 15 U/L (ref 0–35)
AST: 21 U/L (ref 0–37)
Albumin: 4.3 g/dL (ref 3.5–5.2)
Alkaline Phosphatase: 60 U/L (ref 39–117)
BUN: 24 mg/dL — ABNORMAL HIGH (ref 6–23)
CO2: 30 mEq/L (ref 19–32)
Calcium: 9.7 mg/dL (ref 8.4–10.5)
Chloride: 103 mEq/L (ref 96–112)
Creatinine, Ser: 1.1 mg/dL (ref 0.40–1.20)
GFR: 50.81 mL/min — ABNORMAL LOW (ref >60.00)
Glucose, Bld: 94 mg/dL (ref 70–99)
Potassium: 3.8 mEq/L (ref 3.5–5.1)
Sodium: 140 mEq/L (ref 135–145)
Total Bilirubin: 0.5 mg/dL (ref 0.2–1.2)
Total Protein: 7.2 g/dL (ref 6.0–8.3)

## 2021-02-28 LAB — URINALYSIS, ROUTINE W REFLEX MICROSCOPIC
Bilirubin Urine: NEGATIVE
Ketones, ur: NEGATIVE
Nitrite: NEGATIVE
Specific Gravity, Urine: 1.025 (ref 1.000–1.030)
Total Protein, Urine: NEGATIVE
Urine Glucose: NEGATIVE
Urobilinogen, UA: 0.2 (ref 0.0–1.0)
pH: 5.5 (ref 5.0–8.0)

## 2021-02-28 LAB — LIPID PANEL
Cholesterol: 167 mg/dL (ref 0–200)
HDL: 62.3 mg/dL (ref >39.00)
LDL Cholesterol: 93 mg/dL (ref 0–99)
NonHDL: 105.06
Total CHOL/HDL Ratio: 3
Triglycerides: 59 mg/dL (ref 0.0–149.0)
VLDL: 11.8 mg/dL (ref 0.0–40.0)

## 2021-02-28 LAB — CBC
HCT: 41.1 % (ref 36.0–46.0)
Hemoglobin: 13.4 g/dL (ref 12.0–15.0)
MCHC: 32.7 g/dL (ref 30.0–36.0)
MCV: 86 fl (ref 78.0–100.0)
Platelets: 327 10*3/uL (ref 150.0–400.0)
RBC: 4.78 Mil/uL (ref 3.87–5.11)
RDW: 14.9 % (ref 11.5–15.5)
WBC: 4.2 10*3/uL (ref 4.0–10.5)

## 2021-02-28 LAB — HEMOGLOBIN A1C: Hgb A1c MFr Bld: 6.7 % — ABNORMAL HIGH (ref 4.6–6.5)

## 2021-02-28 LAB — VITAMIN D 25 HYDROXY (VIT D DEFICIENCY, FRACTURES): VITD: 29.17 ng/mL — ABNORMAL LOW (ref 30.00–100.00)

## 2021-03-01 MED ORDER — ATORVASTATIN CALCIUM 20 MG PO TABS: 20.0000 mg | ORAL_TABLET | Freq: Every day | ORAL | 3 refills | Status: DC

## 2021-03-01 MED ORDER — VITAMIN D (ERGOCALCIFEROL) 1.25 MG (50000 UNIT) PO CAPS
50000.0000 [IU] | ORAL_CAPSULE | ORAL | 3 refills | Status: DC
Start: 2021-03-01 — End: 2021-11-15

## 2021-03-01 NOTE — Addendum Note (Signed)
Addended by: Abelino Derrick A on: 03/01/2021 11:16 AM   Modules accepted: Orders

## 2021-03-01 NOTE — Addendum Note (Signed)
Addended by: Abelino Derrick A on: 03/01/2021 11:22 AM   Modules accepted: Orders

## 2021-03-03 ENCOUNTER — Other Ambulatory Visit: Payer: Self-pay | Admitting: Family Medicine

## 2021-03-03 ENCOUNTER — Other Ambulatory Visit: Payer: Self-pay | Admitting: Nurse Practitioner

## 2021-03-03 DIAGNOSIS — I10 Essential (primary) hypertension: Secondary | ICD-10-CM

## 2021-03-07 ENCOUNTER — Encounter: Payer: Self-pay | Admitting: Family Medicine

## 2021-04-12 ENCOUNTER — Ambulatory Visit (AMBULATORY_SURGERY_CENTER): Payer: Self-pay | Admitting: *Deleted

## 2021-04-12 ENCOUNTER — Other Ambulatory Visit: Payer: Self-pay

## 2021-04-12 VITALS — Ht 66.0 in | Wt 210.0 lb

## 2021-04-12 DIAGNOSIS — Z1211 Encounter for screening for malignant neoplasm of colon: Secondary | ICD-10-CM

## 2021-04-12 NOTE — Progress Notes (Signed)

## 2021-04-19 ENCOUNTER — Encounter: Payer: Self-pay | Admitting: Gastroenterology

## 2021-04-24 ENCOUNTER — Encounter: Payer: Self-pay | Admitting: Certified Registered Nurse Anesthetist

## 2021-04-25 ENCOUNTER — Ambulatory Visit (AMBULATORY_SURGERY_CENTER): Payer: BC Managed Care – PPO | Admitting: Gastroenterology

## 2021-04-25 ENCOUNTER — Encounter: Payer: Self-pay | Admitting: Gastroenterology

## 2021-04-25 VITALS — BP 92/74 | HR 62 | Temp 97.8°F | Resp 13 | Ht 66.0 in | Wt 210.0 lb

## 2021-04-25 DIAGNOSIS — Z1211 Encounter for screening for malignant neoplasm of colon: Secondary | ICD-10-CM | POA: Diagnosis not present

## 2021-04-25 DIAGNOSIS — K5140 Inflammatory polyps of colon without complications: Secondary | ICD-10-CM | POA: Diagnosis not present

## 2021-04-25 DIAGNOSIS — K635 Polyp of colon: Secondary | ICD-10-CM

## 2021-04-25 DIAGNOSIS — D123 Benign neoplasm of transverse colon: Secondary | ICD-10-CM

## 2021-04-25 DIAGNOSIS — K621 Rectal polyp: Secondary | ICD-10-CM | POA: Diagnosis not present

## 2021-04-25 DIAGNOSIS — K6289 Other specified diseases of anus and rectum: Secondary | ICD-10-CM

## 2021-04-25 MED ORDER — SODIUM CHLORIDE 0.9 % IV SOLN: 500.0000 mL | Freq: Once | INTRAVENOUS | Status: AC

## 2021-04-25 NOTE — Op Note (Signed)
[facility] Patient Name: Ann Mcguire Procedure Date: 04/25/2021 10:54 AM MRN: [id number] Endoscopist: Justice Britain , MD Age: 71 Referring MD:  Date of Birth: 06/23/1950 Gender: Female Account #: [id number] Procedure:                Colonoscopy Indications:              Screening for colorectal malignant neoplasm Medicines:                Monitored Anesthesia Care Procedure:                Pre-Anesthesia Assessment:                           - Prior to the procedure, a History and Physical                            was performed, and patient medications and                            allergies were reviewed. The patient's tolerance of                            previous anesthesia was also reviewed. The risks                            and benefits of the procedure and the sedation                            options and risks were discussed with the patient.                            All questions were answered, and informed consent                            was obtained. Prior Anticoagulants: The patient has                            taken no previous anticoagulant or antiplatelet                            agents except for NSAID medication. ASA Grade                            Assessment: II - A patient with mild systemic                            disease. After reviewing the risks and benefits,                            the patient was deemed in satisfactory condition to                            undergo the procedure.  After obtaining informed consent, the colonoscope                            was passed under direct vision. Throughout the                            procedure, the patient's blood pressure, pulse, and                            oxygen saturations were monitored continuously. The                            CF [id number] #[id number] was introduced through the anus                            and advanced to the the cecum,  identified by                            appendiceal orifice and ileocecal valve. The                            colonoscopy was performed without difficulty. The                            patient tolerated the procedure. The quality of the                            bowel preparation was adequate. The ileocecal                            valve, appendiceal orifice, and rectum were                            photographed. Scope In: 11:02:08 AM Scope Out: 11:16:36 AM Scope Withdrawal Time: 0 hours 9 minutes 52 seconds  Total Procedure Duration: 0 hours 14 minutes 28 seconds  Findings:                 The digital rectal exam findings include                            hemorrhoids. Pertinent negatives include no                            palpable rectal lesions.                           A 4 mm polyp was found in the hepatic flexure. The                            polyp was sessile. The polyp was removed with a                            cold snare. Resection and retrieval were complete.  An area of moderately congested mucosa was found in                            the mid rectum and in the distal rectum. This was                            biopsied with a cold forceps for histology.                           Normal mucosa was found in the entire colon                            otherwise.                           Non-bleeding non-thrombosed internal hemorrhoids                            were found during retroflexion, during perianal                            exam and during digital exam. The hemorrhoids were                            Grade II (internal hemorrhoids that prolapse but                            reduce spontaneously). Complications:            No immediate complications. Estimated Blood Loss:     Estimated blood loss was minimal. Impression:               - Hemorrhoids found on digital rectal exam.                           - One 4 mm polyp at  the hepatic flexure, removed                            with a cold snare. Resected and retrieved.                           - Congested mucosa in the mid rectum and in the                            distal rectum. Biopsied.                           - Normal mucosa in the entire examined colon                            otherwise.                           - Non-bleeding non-thrombosed internal hemorrhoids. Recommendation:           - The patient will be observed post-procedure,  until all discharge criteria are met.                           - Discharge patient to home.                           - Patient has a contact number available for                            emergencies. The signs and symptoms of potential                            delayed complications were discussed with the                            patient. Return to normal activities tomorrow.                            Written discharge instructions were provided to the                            patient.                           - High fiber diet.                           - Use FiberCon 1-2 tablets PO daily.                           - Continue present medications.                           - Await pathology results.                           - Repeat colonoscopy in 08/13/08 years for                            surveillance based on pathology results.                           - The findings and recommendations were discussed                            with the patient.                           - The findings and recommendations were discussed                            with the patient's family. Justice Britain, MD 04/25/2021 11:20:49 AM

## 2021-04-25 NOTE — Progress Notes (Signed)
GASTROENTEROLOGY PROCEDURE H&P NOTE   Primary Care Physician: Libby Maw, MD  HPI: Ann Mcguire is a 71 y.o. female who presents for Colonoscopy for screening.  Past Medical History:  Diagnosis Date   Allergy    past hx - sinus   Arthritis    no meds   Chicken pox    Fainting spell    with gall bladder issues   Hyperlipidemia    diet controlled   Hypertension    controlled on meds   Prediabetes    Past Surgical History:  Procedure Laterality Date   CHOLECYSTECTOMY N/A 10/17/2019   Procedure: LAPAROSCOPIC CHOLECYSTECTOMY;  Surgeon: Ralene Ok, MD;  Location: [facility];  Service: General;  Laterality: N/A;   COLONOSCOPY     > 10 yrs ago- normal per pt   TUBAL LIGATION     Current Outpatient Medications  Medication Sig Dispense Refill   atorvastatin (LIPITOR) 20 MG tablet Take 1 tablet (20 mg total) by mouth daily. (Patient not taking: Reported on 04/12/2021) 90 tablet 3   hydrochlorothiazide (HYDRODIURIL) 25 MG tablet TAKE 1 TABLET(25 MG) BY MOUTH DAILY 90 tablet 0   ibuprofen (ADVIL) 200 MG tablet Take 400 mg by mouth every 6 (six) hours as needed for moderate pain.     lisinopril (ZESTRIL) 20 MG tablet TAKE 1 TABLET(20 MG) BY MOUTH DAILY 90 tablet 0   metFORMIN (GLUCOPHAGE XR) 500 MG 24 hr tablet Take one at night prior to bed. 90 tablet 0   Multiple Vitamin (MULTIVITAMIN WITH MINERALS) TABS tablet Take 1 tablet by mouth daily.     Vitamin D, Ergocalciferol, (DRISDOL) 1.25 MG (50000 UNIT) CAPS capsule Take 1 capsule (50,000 Units total) by mouth every 7 (seven) days. 12 capsule 3   No current facility-administered medications for this visit.    Current Outpatient Medications:    atorvastatin (LIPITOR) 20 MG tablet, Take 1 tablet (20 mg total) by mouth daily. (Patient not taking: Reported on 04/12/2021), Disp: 90 tablet, Rfl: 3   hydrochlorothiazide (HYDRODIURIL) 25 MG tablet, TAKE 1 TABLET(25 MG) BY MOUTH DAILY, Disp: 90 tablet, Rfl: 0   ibuprofen  (ADVIL) 200 MG tablet, Take 400 mg by mouth every 6 (six) hours as needed for moderate pain., Disp: , Rfl:    lisinopril (ZESTRIL) 20 MG tablet, TAKE 1 TABLET(20 MG) BY MOUTH DAILY, Disp: 90 tablet, Rfl: 0   metFORMIN (GLUCOPHAGE XR) 500 MG 24 hr tablet, Take one at night prior to bed., Disp: 90 tablet, Rfl: 0   Multiple Vitamin (MULTIVITAMIN WITH MINERALS) TABS tablet, Take 1 tablet by mouth daily., Disp: , Rfl:    Vitamin D, Ergocalciferol, (DRISDOL) 1.25 MG (50000 UNIT) CAPS capsule, Take 1 capsule (50,000 Units total) by mouth every 7 (seven) days., Disp: 12 capsule, Rfl: 3 No Known Allergies Family History  Problem Relation Age of Onset   Hypertension Sister    Colon cancer Neg Hx    Colon polyps Neg Hx    Esophageal cancer Neg Hx    Rectal cancer Neg Hx    Stomach cancer Neg Hx    Social History   Socioeconomic History   Marital status: Divorced    Spouse name: Not on file   Number of children: Not on file   Years of education: Not on file   Highest education level: Not on file  Occupational History   Not on file  Tobacco Use   Smoking status: Never   Smokeless tobacco: Never  Substance and Sexual  Activity   Alcohol use: No   Drug use: No   Sexual activity: Not on file  Other Topics Concern   Not on file  Social History Narrative   Not on file   Social Determinants of Health   Financial Resource Strain: Not on file  Food Insecurity: Not on file  Transportation Needs: Not on file  Physical Activity: Not on file  Stress: Not on file  Social Connections: Not on file  Intimate Partner Violence: Not on file    Physical Exam: There were no vitals filed for this visit. There is no height or weight on file to calculate BMI. GEN: NAD EYE: Sclerae anicteric ENT: MMM CV: Non-tachycardic GI: Soft, NT/ND NEURO:  Alert & Oriented x 3  Lab Results: No results for input(s): WBC, HGB, HCT, PLT in the last 72 hours. BMET No results for input(s): NA, K, CL, CO2,  GLUCOSE, BUN, CREATININE, CALCIUM in the last 72 hours. LFT No results for input(s): PROT, ALBUMIN, AST, ALT, ALKPHOS, BILITOT, BILIDIR, IBILI in the last 72 hours. PT/INR No results for input(s): LABPROT, INR in the last 72 hours.   Impression / Plan: This is a 71 y.o.female who presents for Colonoscopy for screening.  The risks and benefits of endoscopic evaluation/treatment were discussed with the patient and/or family; these include but are not limited to the risk of perforation, infection, bleeding, missed lesions, lack of diagnosis, severe illness requiring hospitalization, as well as anesthesia and sedation related illnesses.  The patient's history has been reviewed, patient examined, no change in status, and deemed stable for procedure.  The patient and/or family is agreeable to proceed.    Justice Britain, MD [facility] Office # [phone]

## 2021-04-25 NOTE — Progress Notes (Signed)
Pt's states no medical or surgical changes since previsit or office visit.   VS taken CW

## 2021-04-25 NOTE — Patient Instructions (Addendum)
Handouts Provided:  High Fiber Diet and Polyps  USE FiberCon 1-2 Tablets by mouth Daily  YOU HAD AN ENDOSCOPIC PROCEDURE TODAY AT [place]:   Refer to the procedure report that was given to you for any specific questions about what was found during the examination.  If the procedure report does not answer your questions, please call your gastroenterologist to clarify.  If you requested that your care partner not be given the details of your procedure findings, then the procedure report has been included in a sealed envelope for you to review at your convenience later.  YOU SHOULD EXPECT: Some feelings of bloating in the abdomen. Passage of more gas than usual.  Walking can help get rid of the air that was put into your GI tract during the procedure and reduce the bloating. If you had a lower endoscopy (such as a colonoscopy or flexible sigmoidoscopy) you may notice spotting of blood in your stool or on the toilet paper. If you underwent a bowel prep for your procedure, you may not have a normal bowel movement for a few days.  Please Note:  You might notice some irritation and congestion in your nose or some drainage.  This is from the oxygen used during your procedure.  There is no need for concern and it should clear up in a day or so.  SYMPTOMS TO REPORT IMMEDIATELY:  Following lower endoscopy (colonoscopy or flexible sigmoidoscopy):  Excessive amounts of blood in the stool  Significant tenderness or worsening of abdominal pains  Swelling of the abdomen that is new, acute  Fever of 100F or higher  For urgent or emergent issues, a gastroenterologist can be reached at any hour by calling [phone]. Do not use MyChart messaging for urgent concerns.    DIET:  We do recommend a small meal at first, but then you may proceed to your regular diet.  Drink plenty of fluids but you should avoid alcoholic beverages for 24 hours.  ACTIVITY:  You should plan to take it easy for  the rest of today and you should NOT DRIVE or use heavy machinery until tomorrow (because of the sedation medicines used during the test).    FOLLOW UP: Our staff will call the number listed on your records 48-72 hours following your procedure to check on you and address any questions or concerns that you may have regarding the information given to you following your procedure. If we do not reach you, we will leave a message.  We will attempt to reach you two times.  During this call, we will ask if you have developed any symptoms of COVID 19. If you develop any symptoms (ie: fever, flu-like symptoms, shortness of breath, cough etc.) before then, please call [phone].  If you test positive for Covid 19 in the 2 weeks post procedure, please call and report this information to [place].    If any biopsies were taken you will be contacted by phone or by letter within the next 1-3 weeks.  Please call us at [phone] if you have not heard about the biopsies in 3 weeks.    SIGNATURES/CONFIDENTIALITY: You and/or your care partner have signed paperwork which will be entered into your electronic medical record.  These signatures attest to the fact that that the information above on your After Visit Summary has been reviewed and is understood.  Full responsibility of the confidentiality of this discharge information lies with you and/or your care-partner.

## 2021-04-25 NOTE — Progress Notes (Signed)
Report given to PACU, vss 

## 2021-04-25 NOTE — Progress Notes (Signed)
1105 Patient experiencing nausea. MD updated and Zofran 4 mg IV given, vss

## 2021-04-25 NOTE — Progress Notes (Signed)
Called to room to assist during endoscopic procedure.  Patient ID and intended procedure confirmed with present staff. Received instructions for my participation in the procedure from the performing physician.  

## 2021-04-27 ENCOUNTER — Encounter: Payer: Self-pay | Admitting: Gastroenterology

## 2021-04-27 ENCOUNTER — Telehealth: Payer: Self-pay

## 2021-04-27 NOTE — Telephone Encounter (Signed)
°  Follow up Call-  Call back number 04/25/2021  Post procedure Call Back phone  # [phone]  Permission to leave phone message Yes  Some recent data might be hidden     Patient questions:  Do you have a fever, pain , or abdominal swelling? No. Pain Score  0 *  Have you tolerated food without any problems? Yes.    Have you been able to return to your normal activities? Yes.    Do you have any questions about your discharge instructions: Diet   No. Medications  No. Follow up visit  No.  Do you have questions or concerns about your Care? No.  Actions: * If pain score is 4 or above: No action needed, pain <4.

## 2021-05-07 ENCOUNTER — Other Ambulatory Visit: Payer: Self-pay | Admitting: Family Medicine

## 2021-05-07 DIAGNOSIS — I10 Essential (primary) hypertension: Secondary | ICD-10-CM

## 2021-05-25 ENCOUNTER — Other Ambulatory Visit: Payer: Self-pay

## 2021-05-26 ENCOUNTER — Ambulatory Visit: Payer: BC Managed Care – PPO | Admitting: Family Medicine

## 2021-05-26 ENCOUNTER — Encounter: Payer: Self-pay | Admitting: Family Medicine

## 2021-05-26 VITALS — BP 126/78 | HR 61 | Temp 97.8°F | Ht 66.0 in | Wt 212.4 lb

## 2021-05-26 DIAGNOSIS — N1831 Chronic kidney disease, stage 3a: Secondary | ICD-10-CM | POA: Diagnosis not present

## 2021-05-26 DIAGNOSIS — I10 Essential (primary) hypertension: Secondary | ICD-10-CM | POA: Diagnosis not present

## 2021-05-26 DIAGNOSIS — E7800 Pure hypercholesterolemia, unspecified: Secondary | ICD-10-CM | POA: Diagnosis not present

## 2021-05-26 DIAGNOSIS — E119 Type 2 diabetes mellitus without complications: Secondary | ICD-10-CM | POA: Diagnosis not present

## 2021-05-26 DIAGNOSIS — E559 Vitamin D deficiency, unspecified: Secondary | ICD-10-CM | POA: Diagnosis not present

## 2021-05-26 LAB — URINALYSIS, ROUTINE W REFLEX MICROSCOPIC
Bilirubin Urine: NEGATIVE
Ketones, ur: NEGATIVE
Nitrite: NEGATIVE
Specific Gravity, Urine: 1.025 (ref 1.000–1.030)
Total Protein, Urine: NEGATIVE
Urine Glucose: NEGATIVE
Urobilinogen, UA: 0.2 (ref 0.0–1.0)
pH: 5.5 (ref 5.0–8.0)

## 2021-05-26 LAB — CBC
HCT: 41.5 % (ref 36.0–46.0)
Hemoglobin: 13.7 g/dL (ref 12.0–15.0)
MCHC: 33 g/dL (ref 30.0–36.0)
MCV: 86.6 fl (ref 78.0–100.0)
Platelets: 345 10*3/uL (ref 150.0–400.0)
RBC: 4.79 Mil/uL (ref 3.87–5.11)
RDW: 14.8 % (ref 11.5–15.5)
WBC: 3.9 10*3/uL — ABNORMAL LOW (ref 4.0–10.5)

## 2021-05-26 LAB — MICROALBUMIN / CREATININE URINE RATIO
Creatinine,U: 113.9 mg/dL
Microalb Creat Ratio: 0.6 mg/g (ref 0.0–30.0)
Microalb, Ur: <0.7 mg/dL (ref 0.0–1.9)

## 2021-05-26 LAB — LIPID PANEL
Cholesterol: 170 mg/dL (ref 0–200)
HDL: 67.8 mg/dL (ref >39.00)
LDL Cholesterol: 91 mg/dL (ref 0–99)
NonHDL: 102.4
Total CHOL/HDL Ratio: 3
Triglycerides: 58 mg/dL (ref 0.0–149.0)
VLDL: 11.6 mg/dL (ref 0.0–40.0)

## 2021-05-26 LAB — LDL CHOLESTEROL, DIRECT: Direct LDL: 94 mg/dL

## 2021-05-26 LAB — BASIC METABOLIC PANEL
BUN: 24 mg/dL — ABNORMAL HIGH (ref 6–23)
CO2: 31 mEq/L (ref 19–32)
Calcium: 9.6 mg/dL (ref 8.4–10.5)
Chloride: 103 mEq/L (ref 96–112)
Creatinine, Ser: 1.19 mg/dL (ref 0.40–1.20)
GFR: 46.15 mL/min — ABNORMAL LOW (ref >60.00)
Glucose, Bld: 98 mg/dL (ref 70–99)
Potassium: 3.5 mEq/L (ref 3.5–5.1)
Sodium: 139 mEq/L (ref 135–145)

## 2021-05-26 LAB — HEMOGLOBIN A1C: Hgb A1c MFr Bld: 6.6 % — ABNORMAL HIGH (ref 4.6–6.5)

## 2021-05-26 MED ORDER — ATORVASTATIN CALCIUM 20 MG PO TABS: 20.0000 mg | ORAL_TABLET | Freq: Every day | ORAL | 3 refills | Status: DC

## 2021-05-26 NOTE — Progress Notes (Signed)
Established Patient Office Visit  Subjective:  Patient ID: Ann Mcguire, female    DOB: [birth year]  Age: 71 y.o. MRN: [id number]  CC:  Chief Complaint  Patient presents with   Follow-up    3 month follow, no concerns. Patient fasting.     HPI Ann Mcguire presents for follow-up of hypertension, diabetes, elevated cholesterol, CKD and vitamin D deficiency.  Continues metformin for diabetes.  Blood pressure controlled with lisinopril and HCTZ.  She elected not to start the atorvastatin in order to get diet a chance to lower cholesterol.  She did finally go for colonoscopy.  Past Medical History:  Diagnosis Date   Allergy    past hx - sinus   Arthritis    no meds   Chicken pox    Fainting spell    with gall bladder issues   Hyperlipidemia    diet controlled   Hypertension    controlled on meds   Prediabetes     Past Surgical History:  Procedure Laterality Date   CHOLECYSTECTOMY N/A 10/17/2019   Procedure: LAPAROSCOPIC CHOLECYSTECTOMY;  Surgeon: Ralene Ok, MD;  Location: [facility];  Service: General;  Laterality: N/A;   COLONOSCOPY     > 10 yrs ago- normal per pt   TUBAL LIGATION      Family History  Problem Relation Age of Onset   Hypertension Sister    Colon cancer Neg Hx    Colon polyps Neg Hx    Esophageal cancer Neg Hx    Rectal cancer Neg Hx    Stomach cancer Neg Hx     Social History   Socioeconomic History   Marital status: Divorced    Spouse name: Not on file   Number of children: Not on file   Years of education: Not on file   Highest education level: Not on file  Occupational History   Not on file  Tobacco Use   Smoking status: Never   Smokeless tobacco: Never  Vaping Use   Vaping Use: Never used  Substance and Sexual Activity   Alcohol use: No   Drug use: No   Sexual activity: Not on file  Other Topics Concern   Not on file  Social History Narrative   Not on file   Social Determinants of Health   Financial Resource  Strain: Not on file  Food Insecurity: Not on file  Transportation Needs: Not on file  Physical Activity: Not on file  Stress: Not on file  Social Connections: Not on file  Intimate Partner Violence: Not on file    Outpatient Medications Prior to Visit  Medication Sig Dispense Refill   hydrochlorothiazide (HYDRODIURIL) 25 MG tablet TAKE 1 TABLET(25 MG) BY MOUTH DAILY 90 tablet 0   ibuprofen (ADVIL) 200 MG tablet Take 400 mg by mouth every 6 (six) hours as needed for moderate pain.     lisinopril (ZESTRIL) 20 MG tablet TAKE 1 TABLET(20 MG) BY MOUTH DAILY 90 tablet 0   metFORMIN (GLUCOPHAGE XR) 500 MG 24 hr tablet Take one at night prior to bed. 90 tablet 0   Multiple Vitamin (MULTIVITAMIN WITH MINERALS) TABS tablet Take 1 tablet by mouth daily.     Vitamin D, Ergocalciferol, (DRISDOL) 1.25 MG (50000 UNIT) CAPS capsule Take 1 capsule (50,000 Units total) by mouth every 7 (seven) days. 12 capsule 3   atorvastatin (LIPITOR) 20 MG tablet Take 1 tablet (20 mg total) by mouth daily. (Patient not taking: Reported on 04/12/2021) 90 tablet 3  Facility-Administered Medications Prior to Visit  Medication Dose Route Frequency Provider Last Rate Last Admin   0.9 %  sodium chloride infusion  500 mL Intravenous Once Mansouraty, Telford Nab., MD        No Known Allergies  ROS Review of Systems  Constitutional:  Negative for fatigue, fever and unexpected weight change.  HENT: Negative.    Eyes:  Negative for photophobia and visual disturbance.  Respiratory: Negative.    Cardiovascular: Negative.   Gastrointestinal: Negative.   Endocrine: Negative for polyphagia and polyuria.  Genitourinary: Negative.   Musculoskeletal:  Negative for arthralgias and joint swelling.  Skin:  Negative for color change and pallor.  Neurological:  Negative for speech difficulty and weakness.  Hematological:  Does not bruise/bleed easily.  Psychiatric/Behavioral: Negative.       Objective:    Physical Exam Vitals  and nursing note reviewed.  Constitutional:      General: She is not in acute distress.    Appearance: Normal appearance. She is not ill-appearing, toxic-appearing or diaphoretic.  HENT:     Head: Normocephalic and atraumatic.     Right Ear: External ear normal.     Left Ear: External ear normal.     Mouth/Throat:     Mouth: Mucous membranes are moist.     Pharynx: Oropharynx is clear. No oropharyngeal exudate or posterior oropharyngeal erythema.  Eyes:     General: No scleral icterus.       Right eye: No discharge.        Left eye: No discharge.     Extraocular Movements: Extraocular movements intact.     Conjunctiva/sclera: Conjunctivae normal.     Pupils: Pupils are equal, round, and reactive to light.  Cardiovascular:     Rate and Rhythm: Normal rate and regular rhythm.     Pulses:          Dorsalis pedis pulses are 2+ on the right side and 2+ on the left side.       Posterior tibial pulses are 1+ on the right side and 1+ on the left side.  Pulmonary:     Effort: Pulmonary effort is normal.     Breath sounds: Normal breath sounds.  Abdominal:     General: Bowel sounds are normal.  Musculoskeletal:     Cervical back: No rigidity or tenderness.     Right lower leg: No edema.     Left lower leg: No edema.  Lymphadenopathy:     Cervical: No cervical adenopathy.  Skin:    General: Skin is warm and dry.  Neurological:     Mental Status: She is alert and oriented to person, place, and time.  Psychiatric:        Mood and Affect: Mood normal.        Behavior: Behavior normal.   Diabetic Foot Exam - Simple   Simple Foot Form Diabetic Foot exam was performed with the following findings: Yes 05/26/2021  8:56 AM  Visual Inspection See comments: Yes Sensation Testing Intact to touch and monofilament testing bilaterally: Yes Pulse Check Posterior Tibialis and Dorsalis pulse intact bilaterally: Yes Comments Feet are cavus bilaterally without lesions or rashes.      BP  126/78 (BP Location: Right Arm, Patient Position: Sitting, Cuff Size: Normal)    Pulse 61    Temp 97.8 F (36.6 C) (Temporal)    Ht 5\' 6"  (1.676 m)    Wt 212 lb 6.4 oz (96.3 kg)    SpO2 95%  BMI 34.28 kg/m  Wt Readings from Last 3 Encounters:  05/26/21 212 lb 6.4 oz (96.3 kg)  04/25/21 210 lb (95.3 kg)  04/12/21 210 lb (95.3 kg)    The 10-year ASCVD risk score (Arnett DK, et al., 2019) is: 23.6%   Values used to calculate the score:     Age: 57 years     Sex: Female     Is Non-Hispanic African American: Yes     Diabetic: Yes     Tobacco smoker: No     Systolic Blood Pressure: 361 mmHg     Is BP treated: Yes     HDL Cholesterol: 62.3 mg/dL     Total Cholesterol: 167 mg/dL  Health Maintenance Due  Topic Date Due   Zoster Vaccines- Shingrix (1 of 2) Never done   Pneumonia Vaccine 33+ Years old (1 - PCV) Never done   OPHTHALMOLOGY EXAM  01/13/2021    There are no preventive care reminders to display for this patient.  Lab Results  Component Value Date   TSH 1.985 10/14/2019   Lab Results  Component Value Date   WBC 4.2 02/28/2021   HGB 13.4 02/28/2021   HCT 41.1 02/28/2021   MCV 86.0 02/28/2021   PLT 327.0 02/28/2021   Lab Results  Component Value Date   NA 140 02/28/2021   K 3.8 02/28/2021   CO2 30 02/28/2021   GLUCOSE 94 02/28/2021   BUN 24 (H) 02/28/2021   CREATININE 1.10 02/28/2021   BILITOT 0.5 02/28/2021   ALKPHOS 60 02/28/2021   AST 21 02/28/2021   ALT 15 02/28/2021   PROT 7.2 02/28/2021   ALBUMIN 4.3 02/28/2021   CALCIUM 9.7 02/28/2021   ANIONGAP 4 (L) 10/17/2019   GFR 50.81 (L) 02/28/2021   Lab Results  Component Value Date   CHOL 167 02/28/2021   Lab Results  Component Value Date   HDL 62.30 02/28/2021   Lab Results  Component Value Date   LDLCALC 93 02/28/2021   Lab Results  Component Value Date   TRIG 59.0 02/28/2021   Lab Results  Component Value Date   CHOLHDL 3 02/28/2021   Lab Results  Component Value Date   HGBA1C 6.7 (H)  02/28/2021      Assessment & Plan:   Problem List Items Addressed This Visit       Cardiovascular and Mediastinum   Essential hypertension   Relevant Medications   atorvastatin (LIPITOR) 20 MG tablet   Other Relevant Orders   CBC   Urinalysis, Routine w reflex microscopic   Microalbumin / creatinine urine ratio     Endocrine   Type 2 diabetes mellitus without complication, without long-term current use of insulin (HCC) - Primary   Relevant Medications   atorvastatin (LIPITOR) 20 MG tablet   Other Relevant Orders   Basic metabolic panel   CBC   Hemoglobin A1c   Urinalysis, Routine w reflex microscopic   Microalbumin / creatinine urine ratio     Other   Vitamin D deficiency   Other Visit Diagnoses     Stage 3a chronic kidney disease ([place])       Relevant Orders   Basic metabolic panel   CBC   Microalbumin / creatinine urine ratio   Elevated cholesterol       Relevant Medications   atorvastatin (LIPITOR) 20 MG tablet   Other Relevant Orders   LDL cholesterol, direct   Lipid panel       Meds ordered this encounter  Medications   atorvastatin (LIPITOR) 20 MG tablet    Sig: Take 1 tablet (20 mg total) by mouth daily.    Dispense:  90 tablet    Refill:  3    Follow-up: Return in about 6 months (around 11/23/2021), or Please start cholesterol medicine..  Discussed the importance of taking a statin with her elevated ASCVD risk score.  Continue low-fat low-cholesterol diet and weight loss.  Continue exercise.  We will schedule for eye check. May consider an SGLT2 antagonist pending lab results.   Libby Maw, MD

## 2021-06-13 ENCOUNTER — Other Ambulatory Visit: Payer: Self-pay | Admitting: Family Medicine

## 2021-06-13 ENCOUNTER — Telehealth: Payer: Self-pay | Admitting: Family Medicine

## 2021-06-13 DIAGNOSIS — I10 Essential (primary) hypertension: Secondary | ICD-10-CM

## 2021-06-13 NOTE — Telephone Encounter (Signed)
Caller Name: kianah harries  ?Call back phone #: [phone] ? ?MEDICATION(S): lisinopril (ZESTRIL) 20 MG tablet [[id number]]  ? ? ?Days of Med Remaining: she is completely out ? ?Has the patient contacted their pharmacy (YES/NO)?  yes ?IF YES, when and what did the pharmacy advise? Call your PCP ?IF NO, request that the patient contact the pharmacy for the refills in the future.  ?           The pharmacy will send an electronic request (except for controlled medications). ? ?Preferred Pharmacy: [facility] [place], [address] AT [address]  ?[place], [place] [address]  ?Phone:  [phone]  Fax:  [phone]  ?DEA #:  [id number] ? ?~~~Please advise patient/caregiver to allow 2-3 business days to process RX refills. ? ?

## 2021-07-08 DIAGNOSIS — E119 Type 2 diabetes mellitus without complications: Secondary | ICD-10-CM | POA: Diagnosis not present

## 2021-08-07 DIAGNOSIS — E119 Type 2 diabetes mellitus without complications: Secondary | ICD-10-CM | POA: Diagnosis not present

## 2021-08-21 ENCOUNTER — Other Ambulatory Visit: Payer: Self-pay | Admitting: Family Medicine

## 2021-08-21 DIAGNOSIS — I10 Essential (primary) hypertension: Secondary | ICD-10-CM

## 2021-09-06 ENCOUNTER — Other Ambulatory Visit: Payer: Self-pay | Admitting: Family Medicine

## 2021-09-06 DIAGNOSIS — I10 Essential (primary) hypertension: Secondary | ICD-10-CM

## 2021-09-07 DIAGNOSIS — E119 Type 2 diabetes mellitus without complications: Secondary | ICD-10-CM | POA: Diagnosis not present

## 2021-10-07 DIAGNOSIS — E119 Type 2 diabetes mellitus without complications: Secondary | ICD-10-CM | POA: Diagnosis not present

## 2021-11-07 DIAGNOSIS — E119 Type 2 diabetes mellitus without complications: Secondary | ICD-10-CM | POA: Diagnosis not present

## 2021-11-08 ENCOUNTER — Telehealth: Payer: BC Managed Care – PPO | Admitting: Physician Assistant

## 2021-11-08 DIAGNOSIS — J029 Acute pharyngitis, unspecified: Secondary | ICD-10-CM

## 2021-11-08 MED ORDER — PREDNISONE 10 MG (21) PO TBPK: ORAL_TABLET | ORAL | 0 refills | Status: DC

## 2021-11-08 NOTE — Progress Notes (Signed)
Virtual Visit Consent   Ann Mcguire, you are scheduled for a virtual visit with a [facility] provider today. Just as with appointments in the office, your consent must be obtained to participate. Your consent will be active for this visit and any virtual visit you may have with one of our providers in the next 365 days. If you have a MyChart account, a copy of this consent can be sent to you electronically.  As this is a virtual visit, video technology does not allow for your provider to perform a traditional examination. This may limit your provider's ability to fully assess your condition. If your provider identifies any concerns that need to be evaluated in person or the need to arrange testing (such as labs, EKG, etc.), we will make arrangements to do so. Although advances in technology are sophisticated, we cannot ensure that it will always work on either your end or our end. If the connection with a video visit is poor, the visit may have to be switched to a telephone visit. With either a video or telephone visit, we are not always able to ensure that we have a secure connection.  By engaging in this virtual visit, you consent to the provision of healthcare and authorize for your insurance to be billed (if applicable) for the services provided during this visit. Depending on your insurance coverage, you may receive a charge related to this service.  I need to obtain your verbal consent now. Are you willing to proceed with your visit today? QUENTIN STREBEL has provided verbal consent on 11/08/2021 for a virtual visit (video or telephone). Ann Mcguire, [place]  Date: 11/08/2021 7:36 PM  Virtual Visit via Video Note   I, Ann Mcguire, connected with  BRYNA RAZAVI  ([id number], [birth year]) on 11/08/21 at  7:30 PM EDT by a video-enabled telemedicine application and verified that I am speaking with the correct person using two identifiers.  Location: Patient: Virtual Visit  Location Patient: Home Provider: Virtual Visit Location Provider: Home Office   I discussed the limitations of evaluation and management by telemedicine and the availability of in person appointments. The patient expressed understanding and agreed to proceed.    History of Present Illness: Ann Mcguire is a 71 y.o. who identifies as a female who was assigned female at birth, and is being seen today for significant sore throat. Endorses throat pain starting initially Monday evening. Yesterday seemed slightly worse. Today is more substantially painful. Noting significant odynophagia and anorexia because of this. Is keeping hydrated. Denies fever, chills. Has also noted some associated nasal congestion and PND along with this. Denies sinus pressure or pain, sneezing, watery eyes. Denies recent travel or known sick contact.    HPI: HPI  Problems:  Patient Active Problem List   Diagnosis Date Noted   Acute cholecystitis 10/17/2019   Symptomatic cholelithiasis 60/73/7106   Biliary colic 26/94/8546   Abdominal pain 10/14/2019   Common bile duct dilatation 10/14/2019   Elevated LFTs 10/14/2019   Obesity (BMI 30-39.9) 10/14/2019   Ovarian cyst 10/14/2019   AKI (acute kidney injury) ([place]) 10/14/2019   Abnormal urine 11/11/2017   Neutropenia ([place]) 11/11/2017   Trochanteric bursitis of left hip 11/01/2017   Pyuria 11/01/2017   Medically noncompliant 11/01/2017   Essential hypertension 03/22/2017   Type 2 diabetes mellitus without complication, without long-term current use of insulin ([place]) 03/22/2017   Vitamin D deficiency 03/22/2017   Screening for osteoporosis 03/22/2017    Allergies:  No Known Allergies Medications:  Current Outpatient Medications:    predniSONE (STERAPRED UNI-PAK 21 TAB) 10 MG (21) TBPK tablet, Take following package directions, Disp: 21 tablet, Rfl: 0   atorvastatin (LIPITOR) 20 MG tablet, Take 1 tablet (20 mg total) by mouth daily., Disp: 90 tablet, Rfl: 3    hydrochlorothiazide (HYDRODIURIL) 25 MG tablet, TAKE 1 TABLET(25 MG) BY MOUTH DAILY, Disp: 90 tablet, Rfl: 0   ibuprofen (ADVIL) 200 MG tablet, Take 400 mg by mouth every 6 (six) hours as needed for moderate pain., Disp: , Rfl:    lisinopril (ZESTRIL) 20 MG tablet, TAKE 1 TABLET(20 MG) BY MOUTH DAILY, Disp: 90 tablet, Rfl: 0   metFORMIN (GLUCOPHAGE XR) 500 MG 24 hr tablet, Take one at night prior to bed., Disp: 90 tablet, Rfl: 0   Multiple Vitamin (MULTIVITAMIN WITH MINERALS) TABS tablet, Take 1 tablet by mouth daily., Disp: , Rfl:    Vitamin D, Ergocalciferol, (DRISDOL) 1.25 MG (50000 UNIT) CAPS capsule, Take 1 capsule (50,000 Units total) by mouth every 7 (seven) days., Disp: 12 capsule, Rfl: 3  Current Facility-Administered Medications:    0.9 %  sodium chloride infusion, 500 mL, Intravenous, Once, Mansouraty, Telford Nab., MD  Observations/Objective: Patient is well-developed, well-nourished in no acute distress.  Resting comfortably at home.  Head is normocephalic, atraumatic.  No labored breathing. Speech is clear and coherent with logical content.  Patient is alert and oriented at baseline.  Significant posterior oropharyngeal erythema without appreciated edema or tonsillar swelling. No exudate noted. Uvula is midline and without lesion.   Assessment and Plan: 1. Viral pharyngitis - predniSONE (STERAPRED UNI-PAK 21 TAB) 10 MG (21) TBPK tablet; Take following package directions  Dispense: 21 tablet; Refill: 0  Supportive measures and OTC medications reviewed. Will add on a prednisone taper to help reduce inflammation and alleviate pain to make it easier to eat. Start OTC Claritin once daily as well along with saline nasal rinse. Follow-up in person for any non-resolving, new or worsening symptoms despite treatment.   Follow Up Instructions: I discussed the assessment and treatment plan with the patient. The patient was provided an opportunity to ask questions and all were answered. The  patient agreed with the plan and demonstrated an understanding of the instructions.  A copy of instructions were sent to the patient via MyChart unless otherwise noted below.   The patient was advised to call back or seek an in-person evaluation if the symptoms worsen or if the condition fails to improve as anticipated.  Time:  I spent 10 minutes with the patient via telehealth technology discussing the above problems/concerns.    Ann Rio, PA-C

## 2021-11-08 NOTE — Patient Instructions (Signed)
  Ann Mcguire, thank you for joining Ann Rio, PA-C for today's virtual visit.  While this provider is not your primary care provider (PCP), if your PCP is located in our provider database this encounter information will be shared with them immediately following your visit.  Consent: (Patient) Ann Mcguire provided verbal consent for this virtual visit at the beginning of the encounter.  Current Medications:  Current Outpatient Medications:    predniSONE (STERAPRED UNI-PAK 21 TAB) 10 MG (21) TBPK tablet, Take following package directions, Disp: 21 tablet, Rfl: 0   atorvastatin (LIPITOR) 20 MG tablet, Take 1 tablet (20 mg total) by mouth daily., Disp: 90 tablet, Rfl: 3   hydrochlorothiazide (HYDRODIURIL) 25 MG tablet, TAKE 1 TABLET(25 MG) BY MOUTH DAILY, Disp: 90 tablet, Rfl: 0   ibuprofen (ADVIL) 200 MG tablet, Take 400 mg by mouth every 6 (six) hours as needed for moderate pain., Disp: , Rfl:    lisinopril (ZESTRIL) 20 MG tablet, TAKE 1 TABLET(20 MG) BY MOUTH DAILY, Disp: 90 tablet, Rfl: 0   metFORMIN (GLUCOPHAGE XR) 500 MG 24 hr tablet, Take one at night prior to bed., Disp: 90 tablet, Rfl: 0   Multiple Vitamin (MULTIVITAMIN WITH MINERALS) TABS tablet, Take 1 tablet by mouth daily., Disp: , Rfl:    Vitamin D, Ergocalciferol, (DRISDOL) 1.25 MG (50000 UNIT) CAPS capsule, Take 1 capsule (50,000 Units total) by mouth every 7 (seven) days., Disp: 12 capsule, Rfl: 3  Current Facility-Administered Medications:    0.9 %  sodium chloride infusion, 500 mL, Intravenous, Once, Mansouraty, Telford Nab., MD   Medications ordered in this encounter:  Meds ordered this encounter  Medications   predniSONE (STERAPRED UNI-PAK 21 TAB) 10 MG (21) TBPK tablet    Sig: Take following package directions    Dispense:  21 tablet    Refill:  0    1 standard blister pack taper please.    Order Specific Question:   Supervising Provider    Answer:   Noemi Chapel [[id number]]     *If you need  refills on other medications prior to your next appointment, please contact your pharmacy*  Follow-Up: Call back or seek an in-person evaluation if the symptoms worsen or if the condition fails to improve as anticipated.  Other Instructions Please keep well-hydrated and get plenty of rest.  Take the prednisone tablets as directed. You can use Tylenol OTC with this. Salt water gargles and chloraseptic spray can also be beneficial. Start an OTC, non-drowsy antihistamine like Claritin or Xyzal.   If not resolving or if you note any new/worsening symptoms despite treatment, please seek an in-person evaluation.    If you have been instructed to have an in-person evaluation today at a local Urgent Care facility, please use the link below. It will take you to a list of all of our available [facility], including address, phone number and hours of operation. Please do not delay care.  [facility]  If you or a family member do not have a primary care provider, use the link below to schedule a visit and establish care. When you choose a [facility] primary care physician or advanced practice provider, you gain a long-term partner in health. Find a Primary Care Provider  Learn more about [facility] in-office and virtual care options: [facility] [facility]

## 2021-11-15 ENCOUNTER — Ambulatory Visit: Payer: BC Managed Care – PPO | Admitting: Family Medicine

## 2021-11-15 ENCOUNTER — Encounter: Payer: Self-pay | Admitting: Family Medicine

## 2021-11-15 VITALS — BP 118/66 | HR 61 | Temp 98.1°F | Ht 66.0 in | Wt 210.4 lb

## 2021-11-15 DIAGNOSIS — J069 Acute upper respiratory infection, unspecified: Secondary | ICD-10-CM

## 2021-11-15 MED ORDER — FLUTICASONE PROPIONATE 50 MCG/ACT NA SUSP: 2.0000 | Freq: Every day | NASAL | 6 refills | Status: DC

## 2021-11-15 NOTE — Patient Instructions (Signed)
Rest voice as much as you can Drink hot tea with honey 2-3 times a day. Use steroid nasal spray over the next month to relive nasal and ear congestion.

## 2021-11-15 NOTE — Progress Notes (Signed)
[facility] [address] [place] [address] Dept: [phone] Dept Fax: [phone]  Office Visit  Subjective:    Patient ID: Ann Mcguire, female    DOB: Oct 16, 1950, 71 y.o..   MRN: [id number]  Chief Complaint  Patient presents with   Acute Visit    C/o having hoarseness in voice, sinus drainage, cough.      History of Present Illness:  Patient is in today for with continued hoarseness, sinus drainage and mild cough. She was seen in a virtual visit with Elyn Aquas on 8/2. He prescribed a Medrol dosepak for viral pharyngitis. Ann Mcguire does note that her throat is less sore and that her cough has improved with the use of the prednisone. She was concerned that she had lingering symptoms. 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 She denies any environmental dust issues in her work space. She has been using cough drops, but no other meds currently.  Past Medical History: Patient Active Problem List   Diagnosis Date Noted   Acute cholecystitis 10/17/2019   Symptomatic cholelithiasis 81/82/9937   Biliary colic 16/96/7893   Abdominal pain 10/14/2019   Common bile duct dilatation 10/14/2019   Elevated LFTs 10/14/2019   Obesity (BMI 30-39.9) 10/14/2019   Ovarian cyst 10/14/2019   AKI (acute kidney injury) ([place]) 10/14/2019   Abnormal urine 11/11/2017   Neutropenia ([place]) 11/11/2017   Trochanteric bursitis of left hip 11/01/2017   Pyuria 11/01/2017   Medically noncompliant 11/01/2017   Essential hypertension 03/22/2017   Type 2 diabetes mellitus without complication, without long-term current use of insulin ([place]) 03/22/2017   Vitamin D deficiency 03/22/2017   Screening for osteoporosis 03/22/2017   Past Surgical History:  Procedure Laterality Date   CHOLECYSTECTOMY N/A 10/17/2019   Procedure: LAPAROSCOPIC CHOLECYSTECTOMY;  Surgeon: Ralene Ok, MD;  Location: [facility];  Service: General;  Laterality: N/A;    COLONOSCOPY     > 10 yrs ago- normal per pt   TUBAL LIGATION     Family History  Problem Relation Age of Onset   Hypertension Sister    Colon cancer Neg Hx    Colon polyps Neg Hx    Esophageal cancer Neg Hx    Rectal cancer Neg Hx    Stomach cancer Neg Hx    Outpatient Medications Prior to Visit  Medication Sig Dispense Refill   atorvastatin (LIPITOR) 20 MG tablet Take 1 tablet (20 mg total) by mouth daily. 90 tablet 3   cholecalciferol (VITAMIN D3) 25 MCG (1000 UNIT) tablet Take 1,000 Units by mouth daily.     hydrochlorothiazide (HYDRODIURIL) 25 MG tablet TAKE 1 TABLET(25 MG) BY MOUTH DAILY 90 tablet 0   ibuprofen (ADVIL) 200 MG tablet Take 400 mg by mouth every 6 (six) hours as needed for moderate pain.     lisinopril (ZESTRIL) 20 MG tablet TAKE 1 TABLET(20 MG) BY MOUTH DAILY 90 tablet 0   metFORMIN (GLUCOPHAGE XR) 500 MG 24 hr tablet Take one at night prior to bed. 90 tablet 0   Multiple Vitamin (MULTIVITAMIN WITH MINERALS) TABS tablet Take 1 tablet by mouth daily.     predniSONE (STERAPRED UNI-PAK 21 TAB) 10 MG (21) TBPK tablet Take following package directions 21 tablet 0   Vitamin D, Ergocalciferol, (DRISDOL) 1.25 MG (50000 UNIT) CAPS capsule Take 1 capsule (50,000 Units total) by mouth every 7 (seven) days. 12 capsule 3   Facility-Administered Medications Prior to Visit  Medication Dose Route Frequency Provider Last Rate Last  Admin   0.9 %  sodium chloride infusion  500 mL Intravenous Once Mansouraty, Telford Nab., MD       No Known Allergies    Objective:   Today's Vitals   11/15/21 1526  BP: 118/66  Pulse: 61  Temp: 98.1 F (36.7 C)  TempSrc: Oral  SpO2: 92%  Weight: 210 lb 6.4 oz (95.4 kg)  Height: '5\' 6"'$  (1.676 m)   Body mass index is 33.96 kg/m.   General: Well developed, well nourished. No acute distress. HEENT: Normocephalic, non-traumatic. PERRL, EOMI. Conjunctiva clear. External ears normal. Tms are bulging with   clear fluid bilaterally. Nose with  moderate congestion and clear rhinorrhea. No painon percussion over sinuses.   Mucous membranes moist. Oropharynx clear. Good dentition. Neck: Supple. No lymphadenopathy. No thyromegaly. Lungs: Clear to auscultation bilaterally. No wheezing, rales or rhonchi. CV: RRR without murmurs or rubs. Pulses 2+ bilaterally. Psych: Alert and oriented. Normal mood and affect.  Health Maintenance Due  Topic Date Due   TETANUS/TDAP  Never done   Zoster Vaccines- Shingrix (1 of 2) Never done   Pneumonia Vaccine 22+ Years old (1 - PCV) Never done   OPHTHALMOLOGY EXAM  01/13/2021     Assessment & Plan:   1. Viral URI Ann Mcguire appears to have some lingering symptoms related to a recent viral URI. She has some serous otitis and nasal congestion.  Rest voice as much as you can Drink hot tea with honey 2-3 times a day. Use steroid nasal spray over the next month to relive nasal and ear congestion.  - fluticasone (FLONASE) 50 MCG/ACT nasal spray; Place 2 sprays into both nostrils daily.  Dispense: 16 g; Refill: 6   Return if symptoms worsen or fail to improve.   Haydee Salter, MD

## 2021-11-16 ENCOUNTER — Telehealth: Payer: BC Managed Care – PPO | Admitting: Nurse Practitioner

## 2021-11-24 ENCOUNTER — Ambulatory Visit: Payer: BC Managed Care – PPO | Admitting: Family Medicine

## 2021-11-27 ENCOUNTER — Other Ambulatory Visit: Payer: Self-pay | Admitting: Family Medicine

## 2021-11-27 DIAGNOSIS — I10 Essential (primary) hypertension: Secondary | ICD-10-CM

## 2021-12-05 ENCOUNTER — Other Ambulatory Visit: Payer: Self-pay | Admitting: Family Medicine

## 2021-12-05 DIAGNOSIS — I10 Essential (primary) hypertension: Secondary | ICD-10-CM

## 2021-12-08 ENCOUNTER — Ambulatory Visit: Payer: BC Managed Care – PPO | Admitting: Family Medicine

## 2021-12-08 DIAGNOSIS — E119 Type 2 diabetes mellitus without complications: Secondary | ICD-10-CM | POA: Diagnosis not present

## 2021-12-28 ENCOUNTER — Other Ambulatory Visit (INDEPENDENT_AMBULATORY_CARE_PROVIDER_SITE_OTHER): Payer: BC Managed Care – PPO

## 2021-12-28 ENCOUNTER — Ambulatory Visit: Payer: BC Managed Care – PPO | Admitting: Family Medicine

## 2021-12-28 ENCOUNTER — Encounter: Payer: Self-pay | Admitting: Family Medicine

## 2021-12-28 VITALS — BP 120/70 | HR 54 | Temp 96.9°F | Ht 66.0 in | Wt 213.0 lb

## 2021-12-28 DIAGNOSIS — E119 Type 2 diabetes mellitus without complications: Secondary | ICD-10-CM | POA: Diagnosis not present

## 2021-12-28 DIAGNOSIS — I10 Essential (primary) hypertension: Secondary | ICD-10-CM | POA: Diagnosis not present

## 2021-12-28 DIAGNOSIS — E7800 Pure hypercholesterolemia, unspecified: Secondary | ICD-10-CM

## 2021-12-28 LAB — BASIC METABOLIC PANEL
BUN: 22 mg/dL (ref 6–23)
CO2: 30 mEq/L (ref 19–32)
Calcium: 9.7 mg/dL (ref 8.4–10.5)
Chloride: 101 mEq/L (ref 96–112)
Creatinine, Ser: 1.13 mg/dL (ref 0.40–1.20)
GFR: 48.91 mL/min — ABNORMAL LOW (ref >60.00)
Glucose, Bld: 99 mg/dL (ref 70–99)
Potassium: 3.9 mEq/L (ref 3.5–5.1)
Sodium: 139 mEq/L (ref 135–145)

## 2021-12-28 LAB — LIPID PANEL
Cholesterol: 164 mg/dL (ref 0–200)
HDL: 63.3 mg/dL (ref >39.00)
LDL Cholesterol: 86 mg/dL (ref 0–99)
NonHDL: 100.9
Total CHOL/HDL Ratio: 3
Triglycerides: 73 mg/dL (ref 0.0–149.0)
VLDL: 14.6 mg/dL (ref 0.0–40.0)

## 2021-12-28 NOTE — Progress Notes (Signed)
Established Patient Office Visit  Subjective   Patient ID: Ann Mcguire, female    DOB: 1950-06-03  Age: 71 y.o. MRN: [id number]  Chief Complaint  Patient presents with   Follow-up    6 month follow up, no concerns. Patient fasting.     HPI follow-up of hypertension, diabetes and elevated ldl cholesterol.  Tolerating atorvastatin without issue.  Continues lisinopril and HCTZ for well-controlled blood pressure.  Continues metformin for well-controlled diabetes.    Review of Systems  Constitutional: Negative.   HENT: Negative.    Eyes:  Negative for blurred vision, discharge and redness.  Respiratory: Negative.    Cardiovascular: Negative.   Gastrointestinal:  Negative for abdominal pain.  Genitourinary: Negative.   Musculoskeletal: Negative.  Negative for myalgias.  Skin:  Negative for rash.  Neurological:  Negative for tingling, loss of consciousness and weakness.  Endo/Heme/Allergies:  Negative for polydipsia.      Objective:     BP 120/70 (BP Location: Right Arm, Patient Position: Sitting, Cuff Size: Large)   Pulse (!) 54   Temp (!) 96.9 F (36.1 C) (Temporal)   Ht '5\' 6"'$  (1.676 m)   Wt 213 lb (96.6 kg)   SpO2 98%   BMI 34.38 kg/m    Physical Exam Constitutional:      General: She is not in acute distress.    Appearance: Normal appearance. She is not ill-appearing, toxic-appearing or diaphoretic.  HENT:     Head: Normocephalic and atraumatic.     Right Ear: External ear normal.     Left Ear: External ear normal.     Mouth/Throat:     Mouth: Mucous membranes are moist.     Pharynx: Oropharynx is clear. No oropharyngeal exudate or posterior oropharyngeal erythema.  Eyes:     General: No scleral icterus.       Right eye: No discharge.        Left eye: No discharge.     Extraocular Movements: Extraocular movements intact.     Conjunctiva/sclera: Conjunctivae normal.     Pupils: Pupils are equal, round, and reactive to light.  Cardiovascular:     Rate  and Rhythm: Normal rate and regular rhythm.  Pulmonary:     Effort: Pulmonary effort is normal. No respiratory distress.     Breath sounds: Normal breath sounds.  Musculoskeletal:     Cervical back: No rigidity or tenderness.  Lymphadenopathy:     Cervical: No cervical adenopathy.  Skin:    General: Skin is warm and dry.  Neurological:     Mental Status: She is alert and oriented to person, place, and time.  Psychiatric:        Mood and Affect: Mood normal.        Behavior: Behavior normal.      No results found for any visits on 12/28/21.    The 10-year ASCVD risk score (Arnett DK, et al., 2019) is: 22.4%    Assessment & Plan:   Problem List Items Addressed This Visit       Cardiovascular and Mediastinum   Essential hypertension - Primary   Relevant Orders   Basic metabolic panel     Endocrine   Type 2 diabetes mellitus without complication, without long-term current use of insulin (HCC)   Relevant Orders   Basic metabolic panel   Hemoglobin A1c   Other Visit Diagnoses     Elevated cholesterol       Relevant Orders   Lipid panel  Return in about 6 months (around 06/28/2022).  Patient declines flu vaccine today.  She is planning on having the new COVID booster.  Recommended the new RSV vaccine.  Information was given on RSV.  Continue all current medications.  Libby Maw, MD

## 2022-01-01 LAB — HEMOGLOBIN A1C: Hgb A1c MFr Bld: 6.8 % — ABNORMAL HIGH (ref 4.6–6.5)

## 2022-01-07 DIAGNOSIS — E119 Type 2 diabetes mellitus without complications: Secondary | ICD-10-CM | POA: Diagnosis not present

## 2022-01-12 DIAGNOSIS — Z1231 Encounter for screening mammogram for malignant neoplasm of breast: Secondary | ICD-10-CM | POA: Diagnosis not present

## 2022-01-12 LAB — HM MAMMOGRAPHY

## 2022-01-24 ENCOUNTER — Encounter: Payer: Self-pay | Admitting: Family Medicine

## 2022-02-07 DIAGNOSIS — E119 Type 2 diabetes mellitus without complications: Secondary | ICD-10-CM | POA: Diagnosis not present

## 2022-03-08 ENCOUNTER — Other Ambulatory Visit: Payer: Self-pay | Admitting: Family Medicine

## 2022-03-08 DIAGNOSIS — E559 Vitamin D deficiency, unspecified: Secondary | ICD-10-CM

## 2022-03-09 DIAGNOSIS — E119 Type 2 diabetes mellitus without complications: Secondary | ICD-10-CM | POA: Diagnosis not present

## 2022-03-14 ENCOUNTER — Other Ambulatory Visit: Payer: Self-pay | Admitting: Family Medicine

## 2022-03-14 DIAGNOSIS — I10 Essential (primary) hypertension: Secondary | ICD-10-CM

## 2022-04-09 DIAGNOSIS — E119 Type 2 diabetes mellitus without complications: Secondary | ICD-10-CM | POA: Diagnosis not present

## 2022-04-13 ENCOUNTER — Encounter: Payer: Self-pay | Admitting: Family Medicine

## 2022-04-13 ENCOUNTER — Telehealth (INDEPENDENT_AMBULATORY_CARE_PROVIDER_SITE_OTHER): Payer: BC Managed Care – PPO | Admitting: Family Medicine

## 2022-04-13 VITALS — Ht 66.0 in

## 2022-04-13 DIAGNOSIS — E119 Type 2 diabetes mellitus without complications: Secondary | ICD-10-CM

## 2022-04-13 MED ORDER — TIRZEPATIDE 5 MG/0.5ML ~~LOC~~ SOAJ: 5.0000 mg | SUBCUTANEOUS | 1 refills | Status: DC

## 2022-04-13 NOTE — Progress Notes (Signed)
Established Patient Office Visit   Subjective:  Patient ID: Ann Mcguire, female    DOB: 08/10/1950  Age: 72 y.o. MRN: [id number]  Chief Complaint  Patient presents with   Medication Problem    Discuss switching Metformin to Ozempic per patient Metformin seems to always make her feel bad when taking     HPI Encounter Diagnoses  Name Primary?   Type 2 diabetes mellitus without complication, without long-term current use of insulin ([place]) Yes   Morbid obesity ([place])    Would like to discuss using GLP-1 agonist for control of her diabetes and possible weight loss.   Review of Systems  Constitutional: Negative.   HENT: Negative.    Eyes:  Negative for blurred vision, discharge and redness.  Respiratory: Negative.    Cardiovascular: Negative.   Gastrointestinal:  Negative for abdominal pain.  Genitourinary: Negative.   Musculoskeletal: Negative.  Negative for myalgias.  Skin:  Negative for rash.  Neurological:  Negative for tingling, loss of consciousness and weakness.  Endo/Heme/Allergies:  Negative for polydipsia.     Current Outpatient Medications:    atorvastatin (LIPITOR) 20 MG tablet, Take 1 tablet (20 mg total) by mouth daily., Disp: 90 tablet, Rfl: 3   hydrochlorothiazide (HYDRODIURIL) 25 MG tablet, TAKE 1 TABLET(25 MG) BY MOUTH DAILY, Disp: 90 tablet, Rfl: 0   ibuprofen (ADVIL) 200 MG tablet, Take 400 mg by mouth every 6 (six) hours as needed for moderate pain., Disp: , Rfl:    lisinopril (ZESTRIL) 20 MG tablet, TAKE 1 TABLET(20 MG) BY MOUTH DAILY, Disp: 90 tablet, Rfl: 0   Multiple Vitamin (MULTIVITAMIN WITH MINERALS) TABS tablet, Take 1 tablet by mouth daily., Disp: , Rfl:    tirzepatide (MOUNJARO) 5 MG/0.5ML Pen, Inject 5 mg into the skin once a week., Disp: 6 mL, Rfl: 1   Vitamin D, Ergocalciferol, (DRISDOL) 1.25 MG (50000 UNIT) CAPS capsule, TAKE 1 CAPSULE BY MOUTH EVERY 7 DAYS, Disp: 12 capsule, Rfl: 3   fluticasone (FLONASE) 50 MCG/ACT nasal spray, Place 2  sprays into both nostrils daily. (Patient not taking: Reported on 12/28/2021), Disp: 16 g, Rfl: 6   metFORMIN (GLUCOPHAGE XR) 500 MG 24 hr tablet, Take one at night prior to bed. (Patient not taking: Reported on 04/13/2022), Disp: 90 tablet, Rfl: 0  Current Facility-Administered Medications:    0.9 %  sodium chloride infusion, 500 mL, Intravenous, Once, Mansouraty, Telford Nab., MD   Objective:     Ht '5\' 6"'$  (1.676 m)   BMI 34.38 kg/m    Physical Exam Constitutional:      General: She is not in acute distress.    Appearance: Normal appearance. She is not ill-appearing, toxic-appearing or diaphoretic.  HENT:     Head: Normocephalic and atraumatic.     Right Ear: External ear normal.     Left Ear: External ear normal.  Eyes:     General: No scleral icterus.       Right eye: No discharge.        Left eye: No discharge.     Extraocular Movements: Extraocular movements intact.     Conjunctiva/sclera: Conjunctivae normal.  Pulmonary:     Effort: Pulmonary effort is normal. No respiratory distress.  Skin:    General: Skin is warm and dry.  Neurological:     Mental Status: She is alert and oriented to person, place, and time.  Psychiatric:        Mood and Affect: Mood normal.  Behavior: Behavior normal.      No results found for any visits on 04/13/22.    The 10-year ASCVD risk score (Arnett DK, et al., 2019) is: 21.5%    Assessment & Plan:   Type 2 diabetes mellitus without complication, without long-term current use of insulin ([place]) -     Tirzepatide; Inject 5 mg into the skin once a week.  Dispense: 6 mL; Refill: 1  Morbid obesity ([place]) -     Tirzepatide; Inject 5 mg into the skin once a week.  Dispense: 6 mL; Refill: 1    Return Return to clinic 3 months after starting medication.  Let me know if there are any issues..  Continue metformin.  Discussed warnings including theoretical risk of thyroid C cancer, intestinal obstruction and pancreatitis.  Be associated  with severe abdominal pain that would need immediate evaluation.  Also carbohydrate load ingested could lead to severe nausea with this medication.  Her daughter is Therapist, sports and can help her with the injections.  Ann Maw, MD  Virtual Visit via Video Note  I connected with Ann Mcguire on 04/13/22 at  3:40 PM EST by a video enabled telemedicine application and verified that I am speaking with the correct person using two identifiers.  Location: Patient: alone in her car.  Provider: work   I discussed the limitations of evaluation and management by telemedicine and the availability of in person appointments. The patient expressed understanding and agreed to proceed.  History of Present Illness:    Observations/Objective:   Assessment and Plan:   Follow Up Instructions:    I discussed the assessment and treatment plan with the patient. The patient was provided an opportunity to ask questions and all were answered. The patient agreed with the plan and demonstrated an understanding of the instructions.   The patient was advised to call back or seek an in-person evaluation if the symptoms worsen or if the condition fails to improve as anticipated.  I provided 20 minutes of non-face-to-face time during this encounter.   Ann Maw, MD

## 2022-04-17 ENCOUNTER — Telehealth: Payer: Self-pay | Admitting: Family Medicine

## 2022-04-17 NOTE — Telephone Encounter (Signed)
Pt would like for you to call her. 

## 2022-04-18 ENCOUNTER — Telehealth: Payer: Self-pay

## 2022-04-18 NOTE — Telephone Encounter (Signed)
Called patient to inform of approval no answer LM on identified VM also let patient know that Rx will be ready for pick up tomorrow.

## 2022-04-18 NOTE — Telephone Encounter (Signed)
Spoke with patient calling about Rx per patient the pharmacy states that her [facility] needed PA.Marland Kitchen Done and approved patient aware

## 2022-04-18 NOTE — Telephone Encounter (Signed)
Pharmacy Patient Advocate Encounter  Prior Authorization for [facility] '5mg'$ /0.13m has been approved.    PA# [id number] Effective dates: 04/18/2022 through 04/17/2023     Patient Advocate Encounter   [facility] is required.   PA submitted on 04/18/2022 Key [id number] is pending

## 2022-05-10 DIAGNOSIS — E119 Type 2 diabetes mellitus without complications: Secondary | ICD-10-CM | POA: Diagnosis not present

## 2022-06-08 DIAGNOSIS — E119 Type 2 diabetes mellitus without complications: Secondary | ICD-10-CM | POA: Diagnosis not present

## 2022-07-09 DIAGNOSIS — E119 Type 2 diabetes mellitus without complications: Secondary | ICD-10-CM | POA: Diagnosis not present

## 2022-08-08 DIAGNOSIS — E119 Type 2 diabetes mellitus without complications: Secondary | ICD-10-CM | POA: Diagnosis not present

## 2022-08-10 ENCOUNTER — Ambulatory Visit: Payer: BC Managed Care – PPO | Admitting: Family Medicine

## 2022-08-14 ENCOUNTER — Encounter: Payer: Self-pay | Admitting: Family Medicine

## 2022-08-14 ENCOUNTER — Ambulatory Visit (INDEPENDENT_AMBULATORY_CARE_PROVIDER_SITE_OTHER): Payer: BC Managed Care – PPO | Admitting: Family Medicine

## 2022-08-14 VITALS — BP 104/66 | HR 60 | Temp 97.9°F | Ht 66.0 in | Wt 194.2 lb

## 2022-08-14 DIAGNOSIS — E7800 Pure hypercholesterolemia, unspecified: Secondary | ICD-10-CM

## 2022-08-14 DIAGNOSIS — E119 Type 2 diabetes mellitus without complications: Secondary | ICD-10-CM

## 2022-08-14 DIAGNOSIS — E559 Vitamin D deficiency, unspecified: Secondary | ICD-10-CM

## 2022-08-14 DIAGNOSIS — Z7984 Long term (current) use of oral hypoglycemic drugs: Secondary | ICD-10-CM

## 2022-08-14 DIAGNOSIS — I10 Essential (primary) hypertension: Secondary | ICD-10-CM | POA: Diagnosis not present

## 2022-08-14 LAB — LDL CHOLESTEROL, DIRECT: Direct LDL: 94 mg/dL

## 2022-08-14 LAB — MICROALBUMIN / CREATININE URINE RATIO
Creatinine,U: 123.6 mg/dL
Microalb Creat Ratio: 0.6 mg/g (ref 0.0–30.0)
Microalb, Ur: <0.7 mg/dL (ref 0.0–1.9)

## 2022-08-14 LAB — VITAMIN D 25 HYDROXY (VIT D DEFICIENCY, FRACTURES): VITD: 72.1 ng/mL (ref 30.00–100.00)

## 2022-08-14 LAB — BASIC METABOLIC PANEL
BUN: 29 mg/dL — ABNORMAL HIGH (ref 6–23)
CO2: 28 mEq/L (ref 19–32)
Calcium: 9.6 mg/dL (ref 8.4–10.5)
Chloride: 102 mEq/L (ref 96–112)
Creatinine, Ser: 1.22 mg/dL — ABNORMAL HIGH (ref 0.40–1.20)
GFR: 44.41 mL/min — ABNORMAL LOW (ref >60.00)
Glucose, Bld: 87 mg/dL (ref 70–99)
Potassium: 3.7 mEq/L (ref 3.5–5.1)
Sodium: 139 mEq/L (ref 135–145)

## 2022-08-14 LAB — HEMOGLOBIN A1C: Hgb A1c MFr Bld: 6 % (ref 4.6–6.5)

## 2022-08-14 MED ORDER — LISINOPRIL 10 MG PO TABS: 10.0000 mg | ORAL_TABLET | Freq: Every day | ORAL | 3 refills | Status: DC

## 2022-08-14 MED ORDER — VITAMIN D (ERGOCALCIFEROL) 1.25 MG (50000 UNIT) PO CAPS: 50000.0000 [IU] | ORAL_CAPSULE | ORAL | 3 refills | Status: DC

## 2022-08-14 MED ORDER — HYDROCHLOROTHIAZIDE 25 MG PO TABS: ORAL_TABLET | ORAL | 0 refills | Status: DC

## 2022-08-14 NOTE — Progress Notes (Signed)
Established Patient Office Visit   Subjective:  Patient ID: Ann Mcguire, female    DOB: May 30, 1950  Age: 72 y.o. MRN: [id number]  Chief Complaint  Patient presents with   Medical Management of Chronic Issues    Routine follow up on medications, no concerns. Patient fasting.     HPI Encounter Diagnoses  Name Primary?   Elevated cholesterol Yes   Essential hypertension    Vitamin D deficiency    Type 2 diabetes mellitus without complication, without long-term current use of insulin (HCC)    Follow-up of above.  She has been able to lose 12 pounds.  Blood pressure has been lower.  Tolerating tirzepatide.  Staying active by working and walking.  Her daughter comes over to help   Review of Systems  Constitutional: Negative.   HENT: Negative.    Eyes:  Negative for blurred vision, discharge and redness.  Respiratory: Negative.    Cardiovascular: Negative.   Gastrointestinal:  Negative for abdominal pain.  Genitourinary: Negative.   Musculoskeletal: Negative.  Negative for myalgias.  Skin:  Negative for rash.  Neurological:  Negative for tingling, loss of consciousness and weakness.  Endo/Heme/Allergies:  Negative for polydipsia.     Current Outpatient Medications:    atorvastatin (LIPITOR) 20 MG tablet, Take 1 tablet (20 mg total) by mouth daily., Disp: 90 tablet, Rfl: 3   fluticasone (FLONASE) 50 MCG/ACT nasal spray, Place 2 sprays into both nostrils daily., Disp: 16 g, Rfl: 6   ibuprofen (ADVIL) 200 MG tablet, Take 400 mg by mouth every 6 (six) hours as needed for moderate pain., Disp: , Rfl:    lisinopril (ZESTRIL) 10 MG tablet, Take 1 tablet (10 mg total) by mouth daily., Disp: 90 tablet, Rfl: 3   Multiple Vitamin (MULTIVITAMIN WITH MINERALS) TABS tablet, Take 1 tablet by mouth daily., Disp: , Rfl:    tirzepatide (MOUNJARO) 5 MG/0.5ML Pen, Inject 5 mg into the skin once a week., Disp: 6 mL, Rfl: 1   hydrochlorothiazide (HYDRODIURIL) 25 MG tablet, TAKE 1 TABLET(25 MG)  BY MOUTH DAILY, Disp: 90 tablet, Rfl: 0   Vitamin D, Ergocalciferol, (DRISDOL) 1.25 MG (50000 UNIT) CAPS capsule, Take 1 capsule (50,000 Units total) by mouth every 7 (seven) days., Disp: 12 capsule, Rfl: 3  Current Facility-Administered Medications:    0.9 %  sodium chloride infusion, 500 mL, Intravenous, Once, Mansouraty, Netty Starring., MD   Objective:     BP 104/66 (BP Location: Right Arm, Patient Position: Sitting, Cuff Size: Normal)   Pulse 60   Temp 97.9 F (36.6 C) (Temporal)   Ht 5\' 6"  (1.676 m)   Wt 194 lb 3.2 oz (88.1 kg)   SpO2 99%   BMI 31.34 kg/m  BP Readings from Last 3 Encounters:  08/14/22 104/66  12/28/21 120/70  11/15/21 118/66   Wt Readings from Last 3 Encounters:  08/14/22 194 lb 3.2 oz (88.1 kg)  12/28/21 213 lb (96.6 kg)  11/15/21 210 lb 6.4 oz (95.4 kg)      Physical Exam Constitutional:      General: She is not in acute distress.    Appearance: Normal appearance. She is not ill-appearing, toxic-appearing or diaphoretic.  HENT:     Head: Normocephalic and atraumatic.     Right Ear: External ear normal.     Left Ear: External ear normal.     Mouth/Throat:     Mouth: Mucous membranes are moist.     Pharynx: Oropharynx is clear. No oropharyngeal exudate or posterior  oropharyngeal erythema.  Eyes:     General: No scleral icterus.       Right eye: No discharge.        Left eye: No discharge.     Extraocular Movements: Extraocular movements intact.     Conjunctiva/sclera: Conjunctivae normal.     Pupils: Pupils are equal, round, and reactive to light.  Cardiovascular:     Rate and Rhythm: Normal rate and regular rhythm.     Pulses:          Dorsalis pedis pulses are 2+ on the right side and 2+ on the left side.       Posterior tibial pulses are 1+ on the right side and 1+ on the left side.  Pulmonary:     Effort: Pulmonary effort is normal. No respiratory distress.     Breath sounds: Normal breath sounds.  Musculoskeletal:     Cervical back: No  rigidity or tenderness.  Lymphadenopathy:     Cervical: No cervical adenopathy.  Skin:    General: Skin is warm and dry.  Neurological:     Mental Status: She is alert and oriented to person, place, and time.  Psychiatric:        Mood and Affect: Mood normal.        Behavior: Behavior normal.    Diabetic Foot Exam - Simple   Simple Foot Form Diabetic Foot exam was performed with the following findings: Yes 08/14/2022  9:15 AM  Visual Inspection See comments: Yes Sensation Testing Intact to touch and monofilament testing bilaterally: Yes Pulse Check Posterior Tibialis and Dorsalis pulse intact bilaterally: Yes Comments       No results found for any visits on 08/14/22.    The 10-year ASCVD risk score (Arnett DK, et al., 2019) is: 18.2%    Assessment & Plan:   Elevated cholesterol -     LDL cholesterol, direct  Essential hypertension -     hydroCHLOROthiazide; TAKE 1 TABLET(25 MG) BY MOUTH DAILY  Dispense: 90 tablet; Refill: 0 -     Basic metabolic panel -     Microalbumin / creatinine urine ratio -     Lisinopril; Take 1 tablet (10 mg total) by mouth daily.  Dispense: 90 tablet; Refill: 3  Vitamin D deficiency -     Vitamin D (Ergocalciferol); Take 1 capsule (50,000 Units total) by mouth every 7 (seven) days.  Dispense: 12 capsule; Refill: 3 -     VITAMIN D 25 Hydroxy (Vit-D Deficiency, Fractures)  Type 2 diabetes mellitus without complication, without long-term current use of insulin (HCC) -     Basic metabolic panel -     Hemoglobin A1c -     Microalbumin / creatinine urine ratio -     Lisinopril; Take 1 tablet (10 mg total) by mouth daily.  Dispense: 90 tablet; Refill: 3    Return in about 3 months (around 11/14/2022), or Please check and record blood pressures..  Have decreased lisinopril to 10 mg from 20.  May consider increasing atorvastatin pending results of LDL cholesterol.  This was discussed with patient that it is recommended for her LDL to be lower.   Continue all other medications.  Mliss Sax, MD  5/9 addendum: LDL is not at goal.  Will increase atorvastatin to 40 mg daily.  Vitamin D is well replaced.  Discontinue high-dose weekly pill.  Replace with 5000 IUs of vitamin D3 daily.

## 2022-08-16 MED ORDER — ATORVASTATIN CALCIUM 40 MG PO TABS: 40.0000 mg | ORAL_TABLET | Freq: Every day | ORAL | 3 refills | Status: DC

## 2022-08-16 NOTE — Addendum Note (Signed)
Addended by: Andrez Grime on: 08/16/2022 12:51 PM   Modules accepted: Orders

## 2022-08-16 NOTE — Progress Notes (Signed)
1.  LDL cholesterol needs to be lower it is not at goal.  As discussed, I have increased the atorvastatin to 40 mg daily.  2.  Vitamin D is well replaced with the high-dose weekly pill.  Please stop the high-dose weekly pill and take 5000 units of vitamin D3 daily.  This may be purchased over-the-counter.  3.  Kidney function has dropped a bit.  Please be sure to hydrate well.

## 2022-09-08 DIAGNOSIS — E119 Type 2 diabetes mellitus without complications: Secondary | ICD-10-CM | POA: Diagnosis not present

## 2022-09-14 ENCOUNTER — Other Ambulatory Visit: Payer: Self-pay | Admitting: Family Medicine

## 2022-09-14 DIAGNOSIS — E119 Type 2 diabetes mellitus without complications: Secondary | ICD-10-CM

## 2022-09-21 DIAGNOSIS — H35033 Hypertensive retinopathy, bilateral: Secondary | ICD-10-CM | POA: Diagnosis not present

## 2022-09-21 DIAGNOSIS — E119 Type 2 diabetes mellitus without complications: Secondary | ICD-10-CM | POA: Diagnosis not present

## 2022-09-21 LAB — HM DIABETES EYE EXAM

## 2022-10-08 DIAGNOSIS — E119 Type 2 diabetes mellitus without complications: Secondary | ICD-10-CM | POA: Diagnosis not present

## 2022-10-10 ENCOUNTER — Telehealth: Payer: Self-pay | Admitting: Family Medicine

## 2022-10-10 NOTE — Telephone Encounter (Signed)
Sheyann [phone]  Pt needs a refill for her MOUNJARO 5 MG/0.5ML Pen [[id number]]  She has 1 more injection left. It says it expired on 6/27. She uses [facility] [place]

## 2022-10-12 ENCOUNTER — Other Ambulatory Visit: Payer: Self-pay

## 2022-10-12 DIAGNOSIS — E119 Type 2 diabetes mellitus without complications: Secondary | ICD-10-CM

## 2022-10-12 MED ORDER — MOUNJARO 5 MG/0.5ML ~~LOC~~ SOAJ
SUBCUTANEOUS | 1 refills | Status: DC
Start: 2022-10-12 — End: 2022-11-21

## 2022-11-08 DIAGNOSIS — E119 Type 2 diabetes mellitus without complications: Secondary | ICD-10-CM | POA: Diagnosis not present

## 2022-11-09 ENCOUNTER — Ambulatory Visit (INDEPENDENT_AMBULATORY_CARE_PROVIDER_SITE_OTHER): Payer: BC Managed Care – PPO | Admitting: Family Medicine

## 2022-11-09 ENCOUNTER — Encounter: Payer: Self-pay | Admitting: Family Medicine

## 2022-11-09 VITALS — BP 118/74 | HR 64 | Temp 97.6°F | Ht 66.0 in | Wt 187.6 lb

## 2022-11-09 DIAGNOSIS — N1831 Chronic kidney disease, stage 3a: Secondary | ICD-10-CM

## 2022-11-09 DIAGNOSIS — E119 Type 2 diabetes mellitus without complications: Secondary | ICD-10-CM

## 2022-11-09 DIAGNOSIS — I10 Essential (primary) hypertension: Secondary | ICD-10-CM

## 2022-11-09 DIAGNOSIS — Z6830 Body mass index (BMI) 30.0-30.9, adult: Secondary | ICD-10-CM

## 2022-11-09 DIAGNOSIS — E7800 Pure hypercholesterolemia, unspecified: Secondary | ICD-10-CM | POA: Insufficient documentation

## 2022-11-09 DIAGNOSIS — E559 Vitamin D deficiency, unspecified: Secondary | ICD-10-CM

## 2022-11-09 LAB — BASIC METABOLIC PANEL
BUN: 24 mg/dL — ABNORMAL HIGH (ref 6–23)
CO2: 30 mEq/L (ref 19–32)
Calcium: 9.5 mg/dL (ref 8.4–10.5)
Chloride: 103 mEq/L (ref 96–112)
Creatinine, Ser: 1.14 mg/dL (ref 0.40–1.20)
GFR: 48.1 mL/min — ABNORMAL LOW (ref >60.00)
Glucose, Bld: 73 mg/dL (ref 70–99)
Potassium: 4.2 mEq/L (ref 3.5–5.1)
Sodium: 139 mEq/L (ref 135–145)

## 2022-11-09 LAB — LIPID PANEL
Cholesterol: 160 mg/dL (ref 0–200)
HDL: 58.5 mg/dL (ref >39.00)
LDL Cholesterol: 93 mg/dL (ref 0–99)
NonHDL: 101.39
Total CHOL/HDL Ratio: 3
Triglycerides: 44 mg/dL (ref 0.0–149.0)
VLDL: 8.8 mg/dL (ref 0.0–40.0)

## 2022-11-09 LAB — HEMOGLOBIN A1C: Hgb A1c MFr Bld: 5.9 % (ref 4.6–6.5)

## 2022-11-09 MED ORDER — HYDROCHLOROTHIAZIDE 25 MG PO TABS
ORAL_TABLET | ORAL | 3 refills | Status: AC
Start: 2022-11-09 — End: ?

## 2022-11-09 NOTE — Progress Notes (Signed)
Established Patient Office Visit   Subjective:  Patient ID: Ann Mcguire, female    DOB: 1950/10/08  Age: 72 y.o. MRN: [id number]  Chief Complaint  Patient presents with   Medical Management of Chronic Issues    3 month follow up. Pt doing well.     HPI Encounter Diagnoses  Name Primary?   Elevated cholesterol Yes   Essential hypertension    Type 2 diabetes mellitus without complication, without long-term current use of insulin (HCC)    Stage 3a chronic kidney disease (HCC)    Morbid obesity (HCC)    Vitamin D deficiency    For follow-up of hypertension, type 2 diabetes and elevated LDL cholesterol.  She is doing well with the higher dose of atorvastatin.  She is having no issues taking tirzepatide.  Denies abdominal pain or nausea.  She continues to lose some weight.  Blood pressure has been running in the 120s to 130s over 70s to 80s.   ROS   Current Outpatient Medications:    atorvastatin (LIPITOR) 40 MG tablet, Take 1 tablet (40 mg total) by mouth daily., Disp: 90 tablet, Rfl: 3   fluticasone (FLONASE) 50 MCG/ACT nasal spray, Place 2 sprays into both nostrils daily., Disp: 16 g, Rfl: 6   ibuprofen (ADVIL) 200 MG tablet, Take 400 mg by mouth every 6 (six) hours as needed for moderate pain., Disp: , Rfl:    lisinopril (ZESTRIL) 10 MG tablet, Take 1 tablet (10 mg total) by mouth daily., Disp: 90 tablet, Rfl: 3   Multiple Vitamin (MULTIVITAMIN WITH MINERALS) TABS tablet, Take 1 tablet by mouth daily., Disp: , Rfl:    tirzepatide (MOUNJARO) 5 MG/0.5ML Pen, ADMINISTER 5 MG UNDER THE SKIN 1 TIME A WEEK, Disp: 6 mL, Rfl: 1   hydrochlorothiazide (HYDRODIURIL) 25 MG tablet, TAKE 1 TABLET(25 MG) BY MOUTH DAILY, Disp: 90 tablet, Rfl: 3  Current Facility-Administered Medications:    0.9 %  sodium chloride infusion, 500 mL, Intravenous, Once, Mansouraty, Netty Starring., MD   Objective:     BP 118/74 (BP Location: Left Arm, Patient Position: Sitting, Cuff Size: Normal)   Pulse 64    Temp 97.6 F (36.4 C)   Ht 5\' 6"  (1.676 m)   Wt 187 lb 9.6 oz (85.1 kg)   SpO2 100%   BMI 30.28 kg/m  BP Readings from Last 3 Encounters:  11/09/22 118/74  08/14/22 104/66  12/28/21 120/70   Wt Readings from Last 3 Encounters:  11/09/22 187 lb 9.6 oz (85.1 kg)  08/14/22 194 lb 3.2 oz (88.1 kg)  12/28/21 213 lb (96.6 kg)      Physical Exam   No results found for any visits on 11/09/22.    The 10-year ASCVD risk score (Arnett DK, et al., 2019) is: 22.3%    Assessment & Plan:   Elevated cholesterol -     Lipid panel  Essential hypertension -     hydroCHLOROthiazide; TAKE 1 TABLET(25 MG) BY MOUTH DAILY  Dispense: 90 tablet; Refill: 3 -     Basic metabolic panel  Type 2 diabetes mellitus without complication, without long-term current use of insulin (HCC) -     Basic metabolic panel -     Hemoglobin A1c  Stage 3a chronic kidney disease (HCC) -     Basic metabolic panel  Morbid obesity (HCC)  Vitamin D deficiency    Return in about 1 year (around 11/09/2023).  Information was given on exercising to lose weight as well as preventing  high cholesterol.  Continue all medications as above.  Patient was advised to stop her high-dose weekly vitamin D tablet in favor of a multivitamin.  Follow-up in 1 year or sooner if needed.   Mliss Sax, MD

## 2022-11-21 ENCOUNTER — Other Ambulatory Visit: Payer: Self-pay | Admitting: Family Medicine

## 2022-11-21 DIAGNOSIS — E119 Type 2 diabetes mellitus without complications: Secondary | ICD-10-CM

## 2022-12-09 DIAGNOSIS — E119 Type 2 diabetes mellitus without complications: Secondary | ICD-10-CM | POA: Diagnosis not present

## 2023-01-08 ENCOUNTER — Other Ambulatory Visit: Payer: Self-pay | Admitting: Family Medicine

## 2023-01-08 DIAGNOSIS — E119 Type 2 diabetes mellitus without complications: Secondary | ICD-10-CM | POA: Diagnosis not present

## 2023-01-08 DIAGNOSIS — I10 Essential (primary) hypertension: Secondary | ICD-10-CM

## 2023-01-25 DIAGNOSIS — Z1231 Encounter for screening mammogram for malignant neoplasm of breast: Secondary | ICD-10-CM | POA: Diagnosis not present

## 2023-01-25 LAB — HM MAMMOGRAPHY

## 2023-01-28 ENCOUNTER — Encounter: Payer: Self-pay | Admitting: Family Medicine

## 2023-02-08 DIAGNOSIS — E119 Type 2 diabetes mellitus without complications: Secondary | ICD-10-CM | POA: Diagnosis not present

## 2023-02-09 ENCOUNTER — Other Ambulatory Visit: Payer: Self-pay | Admitting: Family Medicine

## 2023-02-09 DIAGNOSIS — E119 Type 2 diabetes mellitus without complications: Secondary | ICD-10-CM

## 2023-02-09 DIAGNOSIS — I10 Essential (primary) hypertension: Secondary | ICD-10-CM

## 2023-02-11 ENCOUNTER — Other Ambulatory Visit: Payer: Self-pay | Admitting: Family Medicine

## 2023-02-11 DIAGNOSIS — E119 Type 2 diabetes mellitus without complications: Secondary | ICD-10-CM

## 2023-03-10 DIAGNOSIS — E119 Type 2 diabetes mellitus without complications: Secondary | ICD-10-CM | POA: Diagnosis not present

## 2023-04-12 ENCOUNTER — Other Ambulatory Visit (HOSPITAL_COMMUNITY): Payer: Self-pay

## 2023-05-02 ENCOUNTER — Telehealth: Payer: Self-pay | Admitting: Pharmacy Technician

## 2023-05-02 ENCOUNTER — Other Ambulatory Visit (HOSPITAL_COMMUNITY): Payer: Self-pay

## 2023-05-02 NOTE — Telephone Encounter (Signed)
Pharmacy Patient Advocate Encounter   Received notification from CoverMyMeds that prior authorization for Mounjaro 5MG /0.5ML auto-injectors is required/requested.   Insurance verification completed.   The patient is insured through [facility] .   Per test claim: The current 84 day co-pay is, $0.00.  No PA needed at this time. This test claim was processed through [facility]- copay amounts may vary at other pharmacies due to pharmacy/plan contracts, or as the patient moves through the different stages of their insurance plan.

## 2023-05-06 ENCOUNTER — Ambulatory Visit: Payer: BC Managed Care – PPO | Admitting: Family Medicine

## 2023-05-17 ENCOUNTER — Encounter: Payer: Self-pay | Admitting: Family Medicine

## 2023-05-17 ENCOUNTER — Ambulatory Visit: Payer: BC Managed Care – PPO | Admitting: Family Medicine

## 2023-05-17 ENCOUNTER — Telehealth: Payer: Self-pay

## 2023-05-17 VITALS — BP 120/64 | Temp 97.4°F | Ht 66.0 in | Wt 179.6 lb

## 2023-05-17 DIAGNOSIS — I10 Essential (primary) hypertension: Secondary | ICD-10-CM | POA: Diagnosis not present

## 2023-05-17 DIAGNOSIS — N1831 Chronic kidney disease, stage 3a: Secondary | ICD-10-CM | POA: Diagnosis not present

## 2023-05-17 DIAGNOSIS — E559 Vitamin D deficiency, unspecified: Secondary | ICD-10-CM | POA: Diagnosis not present

## 2023-05-17 DIAGNOSIS — Z0000 Encounter for general adult medical examination without abnormal findings: Secondary | ICD-10-CM

## 2023-05-17 DIAGNOSIS — E7800 Pure hypercholesterolemia, unspecified: Secondary | ICD-10-CM | POA: Diagnosis not present

## 2023-05-17 DIAGNOSIS — E119 Type 2 diabetes mellitus without complications: Secondary | ICD-10-CM

## 2023-05-17 DIAGNOSIS — M5412 Radiculopathy, cervical region: Secondary | ICD-10-CM

## 2023-05-17 LAB — CBC WITH DIFFERENTIAL/PLATELET
Basophils Absolute: 0 10*3/uL (ref 0.0–0.1)
Basophils Relative: 1 % (ref 0.0–3.0)
Eosinophils Absolute: 0.1 10*3/uL (ref 0.0–0.7)
Eosinophils Relative: 2.2 % (ref 0.0–5.0)
HCT: 43.1 % (ref 36.0–46.0)
Hemoglobin: 14.1 g/dL (ref 12.0–15.0)
Lymphocytes Relative: 41.3 % (ref 12.0–46.0)
Lymphs Abs: 1.4 10*3/uL (ref 0.7–4.0)
MCHC: 32.8 g/dL (ref 30.0–36.0)
MCV: 86 fL (ref 78.0–100.0)
Monocytes Absolute: 0.4 10*3/uL (ref 0.1–1.0)
Monocytes Relative: 10.3 % (ref 3.0–12.0)
Neutro Abs: 1.6 10*3/uL (ref 1.4–7.7)
Neutrophils Relative %: 45.2 % (ref 43.0–77.0)
Platelets: 343 10*3/uL (ref 150.0–400.0)
RBC: 5.01 Mil/uL (ref 3.87–5.11)
RDW: 14.9 % (ref 11.5–15.5)
WBC: 3.4 10*3/uL — ABNORMAL LOW (ref 4.0–10.5)

## 2023-05-17 LAB — LIPID PANEL
Cholesterol: 135 mg/dL (ref 0–200)
HDL: 72.2 mg/dL (ref >39.00)
LDL Cholesterol: 54 mg/dL (ref 0–99)
NonHDL: 62.41
Total CHOL/HDL Ratio: 2
Triglycerides: 40 mg/dL (ref 0.0–149.0)
VLDL: 8 mg/dL (ref 0.0–40.0)

## 2023-05-17 LAB — URINALYSIS, ROUTINE W REFLEX MICROSCOPIC
Bilirubin Urine: NEGATIVE
Ketones, ur: NEGATIVE
Nitrite: NEGATIVE
Specific Gravity, Urine: 1.02 (ref 1.000–1.030)
Total Protein, Urine: NEGATIVE
Urine Glucose: NEGATIVE
Urobilinogen, UA: 0.2 (ref 0.0–1.0)
pH: 6 (ref 5.0–8.0)

## 2023-05-17 LAB — COMPREHENSIVE METABOLIC PANEL
ALT: 17 U/L (ref 0–35)
AST: 24 U/L (ref 0–37)
Albumin: 4.3 g/dL (ref 3.5–5.2)
Alkaline Phosphatase: 76 U/L (ref 39–117)
BUN: 28 mg/dL — ABNORMAL HIGH (ref 6–23)
CO2: 29 meq/L (ref 19–32)
Calcium: 9.3 mg/dL (ref 8.4–10.5)
Chloride: 102 meq/L (ref 96–112)
Creatinine, Ser: 1.15 mg/dL (ref 0.40–1.20)
GFR: 47.42 mL/min — ABNORMAL LOW (ref >60.00)
Glucose, Bld: 80 mg/dL (ref 70–99)
Potassium: 3.4 meq/L — ABNORMAL LOW (ref 3.5–5.1)
Sodium: 141 meq/L (ref 135–145)
Total Bilirubin: 0.5 mg/dL (ref 0.2–1.2)
Total Protein: 7.3 g/dL (ref 6.0–8.3)

## 2023-05-17 LAB — HEMOGLOBIN A1C: Hgb A1c MFr Bld: 6.2 % (ref 4.6–6.5)

## 2023-05-17 NOTE — Patient Outreach (Signed)
  Care Coordination   In Person Provider Office Visit Note   05/17/2023 Name: Ann Mcguire MRN: [id number] DOB: [birth year]  Ann Mcguire is a 73 y.o. year old female who sees Ann Elsie Sayre, MD for primary care. I engaged with Ann Mcguire in the providers office today.  What matters to the patients health and wellness today?  none    Goals Addressed             This Visit's Progress    COMPLETED: Care coordination activities-No follow up required       Care Coordination Interventions: Discussed services and support. Advised to discuss with primary care physician if services needed in the future.         SDOH assessments and interventions completed:  Yes  SDOH Interventions Today    Flowsheet Row Most Recent Value  SDOH Interventions   Food Insecurity Interventions Intervention Not Indicated  Housing Interventions Intervention Not Indicated  Transportation Interventions Intervention Not Indicated  Health Literacy Interventions Intervention Not Indicated        Care Coordination Interventions:  Yes, provided   Follow up plan: No further intervention required.   Encounter Outcome:  Patient Visit Completed   Ann Sahr J. Maleik Vanderzee RN, MSN [facility], [facility] RN Care Manager Direct Dial: [phone]  Fax: [phone] Website: [email or web address]

## 2023-05-17 NOTE — Patient Instructions (Signed)
 Visit Information  Thank you for taking time to visit with me today. Please don't hesitate to contact me if I can be of assistance to you.   Following are the goals we discussed today:   Goals Addressed             This Visit's Progress    COMPLETED: Care coordination activities-No follow up required       Care Coordination Interventions: Discussed services and support. Advised to discuss with primary care physician if services needed in the future.          If you are experiencing a Mental Health or Behavioral Health Crisis or need someone to talk to, please call the Suicide and Crisis Lifeline: 988   Patient verbalizes understanding of instructions and care plan provided today and agrees to view in MyChart. Active MyChart status and patient understanding of how to access instructions and care plan via MyChart confirmed with patient.     The patient has been provided with contact information for the care management team and has been advised to call with any health related questions or concerns.   Ellsie Violette J. Marcel Gary RN, MSN [facility], [facility] RN Care Manager Direct Dial: [phone]  Fax: [phone] Website: [email or web address]

## 2023-05-17 NOTE — Progress Notes (Signed)
 Established Patient Office Visit   Subjective:  Patient ID: Ann Mcguire, female    DOB: [birth year]  Age: 73 y.o. MRN: [id number]  Chief Complaint  Patient presents with   Annual Exam    CPE pt is fasting.     HPI Encounter Diagnoses  Name Primary?   Healthcare maintenance Yes   Cervical radiculopathy    Type 2 diabetes mellitus without complication, without long-term current use of insulin (HCC)    Stage 3a chronic kidney disease (HCC)    Vitamin D  deficiency    Elevated cholesterol    Essential hypertension    Here for a physical, follow-up of ongoing medical issues and a new problem.  Over the last for 5 days she has been experiencing neck and shoulder pain.  There is occasional tingling in her left arm.  There is no shortness of breath, chest pain nausea or diaphoresis.  No injury.  Right-hand-dominant.  Continues with all medicines below.  Some concerned about calcification seen in the arteries in her breast.  Continues with tirzepatide  without issue and has been able to lose some weight.   Review of Systems  Constitutional: Negative.  Negative for diaphoresis.  HENT: Negative.    Eyes:  Negative for blurred vision, discharge and redness.  Respiratory: Negative.    Cardiovascular: Negative.  Negative for chest pain.  Gastrointestinal:  Negative for abdominal pain.  Genitourinary: Negative.   Musculoskeletal:  Positive for neck pain. Negative for myalgias.  Skin:  Negative for rash.  Neurological:  Positive for tingling. Negative for loss of consciousness and weakness.  Endo/Heme/Allergies:  Negative for polydipsia.     Current Outpatient Medications:    atorvastatin  (LIPITOR) 40 MG tablet, Take 1 tablet (40 mg total) by mouth daily., Disp: 90 tablet, Rfl: 3   fluticasone  (FLONASE ) 50 MCG/ACT nasal spray, Place 2 sprays into both nostrils daily., Disp: 16 g, Rfl: 6   hydrochlorothiazide  (HYDRODIURIL ) 25 MG tablet, TAKE 1 TABLET(25 MG) BY MOUTH DAILY, Disp: 90  tablet, Rfl: 3   ibuprofen (ADVIL) 200 MG tablet, Take 400 mg by mouth every 6 (six) hours as needed for moderate pain., Disp: , Rfl:    lisinopril  (ZESTRIL ) 10 MG tablet, TAKE 1 TABLET(10 MG) BY MOUTH DAILY, Disp: 90 tablet, Rfl: 3   MOUNJARO  5 MG/0.5ML Pen, ADMINISTER 5 MG UNDER THE SKIN 1 TIME A WEEK, Disp: 6 mL, Rfl: 1   Multiple Vitamin (MULTIVITAMIN WITH MINERALS) TABS tablet, Take 1 tablet by mouth daily., Disp: , Rfl:   Current Facility-Administered Medications:    0.9 %  sodium chloride  infusion, 500 mL, Intravenous, Once, Mansouraty, Aloha Raddle., MD   Objective:     BP 120/64   Temp (!) 97.4 F (36.3 C)   Ht 5' 6 (1.676 m)   Wt 179 lb 9.6 oz (81.5 kg)   BMI 28.99 kg/m  BP Readings from Last 3 Encounters:  05/17/23 120/64  11/09/22 118/74  08/14/22 104/66   Wt Readings from Last 3 Encounters:  05/17/23 179 lb 9.6 oz (81.5 kg)  11/09/22 187 lb 9.6 oz (85.1 kg)  08/14/22 194 lb 3.2 oz (88.1 kg)      Physical Exam Constitutional:      General: She is not in acute distress.    Appearance: Normal appearance. She is not ill-appearing, toxic-appearing or diaphoretic.  HENT:     Head: Normocephalic and atraumatic.     Right Ear: Tympanic membrane, ear canal and external ear normal.  Left Ear: Tympanic membrane, ear canal and external ear normal.     Mouth/Throat:     Mouth: Mucous membranes are moist.     Pharynx: Oropharynx is clear. No oropharyngeal exudate or posterior oropharyngeal erythema.  Eyes:     General: No scleral icterus.       Right eye: No discharge.        Left eye: No discharge.     Extraocular Movements: Extraocular movements intact.     Conjunctiva/sclera: Conjunctivae normal.     Pupils: Pupils are equal, round, and reactive to light.  Cardiovascular:     Rate and Rhythm: Normal rate and regular rhythm.  Pulmonary:     Effort: Pulmonary effort is normal. No respiratory distress.     Breath sounds: Normal breath sounds. No wheezing or  rales.  Abdominal:     General: Bowel sounds are normal.     Tenderness: There is no abdominal tenderness. There is no guarding.  Musculoskeletal:     Right shoulder: Tenderness (Mild tenderness in subacromial bursa) present. Normal range of motion.     Cervical back: No rigidity, tenderness or bony tenderness. No pain with movement. Normal range of motion.     Comments: Positive Spurling's to the right.  Skin:    General: Skin is warm and dry.  Neurological:     Mental Status: She is alert and oriented to person, place, and time.  Psychiatric:        Mood and Affect: Mood normal.        Behavior: Behavior normal.      No results found for any visits on 05/17/23.    The 10-year ASCVD risk score (Arnett DK, et al., 2019) is: 23.3%    Assessment & Plan:   Healthcare maintenance  Cervical radiculopathy -     Ambulatory referral to Sports Medicine  Type 2 diabetes mellitus without complication, without long-term current use of insulin (HCC) -     Comprehensive metabolic panel -     Hemoglobin A1c -     Microalbumin / creatinine urine ratio -     Urinalysis, Routine w reflex microscopic  Stage 3a chronic kidney disease (HCC) -     Comprehensive metabolic panel  Vitamin D  deficiency  Elevated cholesterol -     Comprehensive metabolic panel -     Lipid panel  Essential hypertension -     CBC with Differential/Platelet -     Comprehensive metabolic panel -     Microalbumin / creatinine urine ratio -     Urinalysis, Routine w reflex microscopic    Return in about 6 months (around 11/14/2023), or if symptoms worsen or fail to improve.  Information was given on health maintenance and disease prevention.  Information was given on cervical radiculopathy.  Sports medicine referral.  Continue all medications as above.  Adjustments made pending results of labs.  [name], MD

## 2023-05-23 LAB — MICROALBUMIN / CREATININE URINE RATIO
Creatinine,U: 120.9 mg/dL
Microalb Creat Ratio: 6 mg/g (ref 0.0–30.0)
Microalb, Ur: <0.7 mg/dL (ref 0.0–1.9)

## 2023-05-24 ENCOUNTER — Telehealth: Payer: Self-pay

## 2023-05-24 NOTE — Telephone Encounter (Signed)
Clydie Braun called from [place] with updated Microalbumin results on patient. Please see updated results.

## 2023-08-02 ENCOUNTER — Other Ambulatory Visit: Payer: Self-pay | Admitting: Family Medicine

## 2023-08-02 DIAGNOSIS — E119 Type 2 diabetes mellitus without complications: Secondary | ICD-10-CM

## 2023-10-14 ENCOUNTER — Ambulatory Visit: Payer: Self-pay | Admitting: Family Medicine

## 2023-10-14 ENCOUNTER — Encounter: Payer: Self-pay | Admitting: Family Medicine

## 2023-10-14 ENCOUNTER — Ambulatory Visit: Admitting: Family Medicine

## 2023-10-14 VITALS — BP 130/78 | HR 43 | Temp 97.1°F | Ht 66.0 in | Wt 185.0 lb

## 2023-10-14 DIAGNOSIS — R001 Bradycardia, unspecified: Secondary | ICD-10-CM

## 2023-10-14 DIAGNOSIS — E876 Hypokalemia: Secondary | ICD-10-CM

## 2023-10-14 DIAGNOSIS — E119 Type 2 diabetes mellitus without complications: Secondary | ICD-10-CM | POA: Diagnosis not present

## 2023-10-14 DIAGNOSIS — R5383 Other fatigue: Secondary | ICD-10-CM

## 2023-10-14 DIAGNOSIS — E559 Vitamin D deficiency, unspecified: Secondary | ICD-10-CM

## 2023-10-14 LAB — CBC WITH DIFFERENTIAL/PLATELET
Basophils Absolute: 0 K/uL (ref 0.0–0.1)
Basophils Relative: 1 % (ref 0.0–3.0)
Eosinophils Absolute: 0.1 K/uL (ref 0.0–0.7)
Eosinophils Relative: 2.1 % (ref 0.0–5.0)
HCT: 40.2 % (ref 36.0–46.0)
Hemoglobin: 13.1 g/dL (ref 12.0–15.0)
Lymphocytes Relative: 42.4 % (ref 12.0–46.0)
Lymphs Abs: 1.5 K/uL (ref 0.7–4.0)
MCHC: 32.7 g/dL (ref 30.0–36.0)
MCV: 84.1 fl (ref 78.0–100.0)
Monocytes Absolute: 0.4 K/uL (ref 0.1–1.0)
Monocytes Relative: 11.6 % (ref 3.0–12.0)
Neutro Abs: 1.5 K/uL (ref 1.4–7.7)
Neutrophils Relative %: 42.9 % — ABNORMAL LOW (ref 43.0–77.0)
Platelets: 273 K/uL (ref 150.0–400.0)
RBC: 4.77 Mil/uL (ref 3.87–5.11)
RDW: 15.3 % (ref 11.5–15.5)
WBC: 3.6 K/uL — ABNORMAL LOW (ref 4.0–10.5)

## 2023-10-14 LAB — COMPREHENSIVE METABOLIC PANEL WITH GFR
ALT: 14 U/L (ref 0–35)
AST: 21 U/L (ref 0–37)
Albumin: 4.2 g/dL (ref 3.5–5.2)
Alkaline Phosphatase: 62 U/L (ref 39–117)
BUN: 27 mg/dL — ABNORMAL HIGH (ref 6–23)
CO2: 29 meq/L (ref 19–32)
Calcium: 9.5 mg/dL (ref 8.4–10.5)
Chloride: 104 meq/L (ref 96–112)
Creatinine, Ser: 1.09 mg/dL (ref 0.40–1.20)
GFR: 50.43 mL/min — ABNORMAL LOW (ref >60.00)
Glucose, Bld: 72 mg/dL (ref 70–99)
Potassium: 3.6 meq/L (ref 3.5–5.1)
Sodium: 141 meq/L (ref 135–145)
Total Bilirubin: 0.7 mg/dL (ref 0.2–1.2)
Total Protein: 7.1 g/dL (ref 6.0–8.3)

## 2023-10-14 LAB — URINALYSIS, ROUTINE W REFLEX MICROSCOPIC
Bilirubin Urine: NEGATIVE
Ketones, ur: NEGATIVE
Nitrite: NEGATIVE
Specific Gravity, Urine: 1.02 (ref 1.000–1.030)
Total Protein, Urine: NEGATIVE
Urine Glucose: NEGATIVE
Urobilinogen, UA: 0.2 (ref 0.0–1.0)
pH: 6 (ref 5.0–8.0)

## 2023-10-14 LAB — HEMOGLOBIN A1C: Hgb A1c MFr Bld: 6.1 % (ref 4.6–6.5)

## 2023-10-14 LAB — TSH: TSH: 1.81 u[IU]/mL (ref 0.35–5.50)

## 2023-10-14 NOTE — Progress Notes (Signed)
 Established Patient Office Visit   Subjective:  Patient ID: Ann Mcguire, female    DOB: Apr 28, 1950  Age: 73 y.o. MRN: [id number]  Chief Complaint  Patient presents with   Fatigue    Pt complains of fatigue.    Sinusitis    Sinusitis Pertinent negatives include no chills.   Encounter Diagnoses  Name Primary?   Bradycardia Yes   Type 2 diabetes mellitus without complication, without long-term current use of insulin (HCC)    Hypokalemia    Other fatigue    Vitamin D  deficiency    3-week history of fatigue.  Has to rest every 10 to 15 minutes while doing housework.  Denies chest pain shortness of breath or difficulty breathing.  No abdominal pain or constipation.  No difficulty with urination.  She has an eye appointment scheduled soon.  There has been no weight loss.  Chart review shows weight gain.  She has no regular exercise.  Continues to work full-time but is planning on retiring.  She is taking an over-the-counter potassium pill.  She has not started the higher dose 5000 IU vitamin D  tablet continues all of her medications.   Review of Systems  Constitutional:  Positive for malaise/fatigue. Negative for chills, fever and weight loss.  HENT: Negative.    Eyes:  Negative for blurred vision, discharge and redness.  Respiratory: Negative.    Cardiovascular: Negative.   Gastrointestinal:  Negative for abdominal pain, blood in stool, constipation and melena.  Genitourinary: Negative.  Negative for dysuria, frequency, hematuria and urgency.  Musculoskeletal: Negative.  Negative for myalgias.  Skin:  Negative for rash.  Neurological:  Negative for tingling, loss of consciousness and weakness.  Endo/Heme/Allergies:  Negative for polydipsia.      05/17/2023    8:43 AM 08/14/2022    8:45 AM 04/13/2022    3:49 PM  Depression screen PHQ 2/9  Decreased Interest 0 0 0  Down, Depressed, Hopeless 0 0 0  PHQ - 2 Score 0 0 0  Altered sleeping 0    Tired, decreased energy 0     Change in appetite 0    Feeling bad or failure about yourself  0    Trouble concentrating 0    Moving slowly or fidgety/restless 0    Suicidal thoughts 0    PHQ-9 Score 0    Difficult doing work/chores Not difficult at all        Current Outpatient Medications:    atorvastatin  (LIPITOR) 40 MG tablet, Take 1 tablet (40 mg total) by mouth daily., Disp: 90 tablet, Rfl: 3   fluticasone  (FLONASE ) 50 MCG/ACT nasal spray, Place 2 sprays into both nostrils daily., Disp: 16 g, Rfl: 6   hydrochlorothiazide  (HYDRODIURIL ) 25 MG tablet, TAKE 1 TABLET(25 MG) BY MOUTH DAILY, Disp: 90 tablet, Rfl: 3   ibuprofen (ADVIL) 200 MG tablet, Take 400 mg by mouth every 6 (six) hours as needed for moderate pain., Disp: , Rfl:    lisinopril  (ZESTRIL ) 10 MG tablet, TAKE 1 TABLET(10 MG) BY MOUTH DAILY, Disp: 90 tablet, Rfl: 3   MOUNJARO  5 MG/0.5ML Pen, ADMINISTER 5 MG UNDER THE SKIN 1 TIME A WEEK, Disp: 6 mL, Rfl: 1   Multiple Vitamin (MULTIVITAMIN WITH MINERALS) TABS tablet, Take 1 tablet by mouth daily., Disp: , Rfl:   Current Facility-Administered Medications:    0.9 %  sodium chloride  infusion, 500 mL, Intravenous, Once, Mansouraty, Aloha Raddle., MD   Objective:     BP 130/78 (Cuff Size: Normal)  Pulse (!) 43   Temp (!) 97.1 F (36.2 C) (Temporal)   Ht 5' 6 (1.676 m)   Wt 185 lb (83.9 kg)   SpO2 94%   BMI 29.86 kg/m  BP Readings from Last 3 Encounters:  10/14/23 130/78  05/17/23 120/64  11/09/22 118/74   Wt Readings from Last 3 Encounters:  10/14/23 185 lb (83.9 kg)  05/17/23 179 lb 9.6 oz (81.5 kg)  11/09/22 187 lb 9.6 oz (85.1 kg)      Physical Exam Constitutional:      General: She is not in acute distress.    Appearance: Normal appearance. She is not ill-appearing, toxic-appearing or diaphoretic.  HENT:     Head: Normocephalic and atraumatic.     Right Ear: External ear normal.     Left Ear: External ear normal.     Mouth/Throat:     Mouth: Mucous membranes are moist.      Pharynx: Oropharynx is clear. No oropharyngeal exudate or posterior oropharyngeal erythema.  Eyes:     General: No scleral icterus.       Right eye: No discharge.        Left eye: No discharge.     Extraocular Movements: Extraocular movements intact.     Conjunctiva/sclera: Conjunctivae normal.     Pupils: Pupils are equal, round, and reactive to light.  Cardiovascular:     Rate and Rhythm: Normal rate and regular rhythm.  Pulmonary:     Effort: Pulmonary effort is normal. No respiratory distress.     Breath sounds: Normal breath sounds. No wheezing or rales.  Abdominal:     General: Bowel sounds are normal.  Musculoskeletal:     Cervical back: No rigidity or tenderness.  Lymphadenopathy:     Cervical: No cervical adenopathy.  Skin:    General: Skin is warm and dry.  Neurological:     Mental Status: She is alert and oriented to person, place, and time.  Psychiatric:        Mood and Affect: Mood normal.        Behavior: Behavior normal.      No results found for any visits on 10/14/23.    The 10-year ASCVD risk score (Arnett DK, et al., 2019) is: 24%    Assessment & Plan:   Bradycardia -     EKG 12-Lead -     TSH  Type 2 diabetes mellitus without complication, without long-term current use of insulin (HCC) -     Hemoglobin A1c -     Comprehensive metabolic panel with GFR  Hypokalemia -     Comprehensive metabolic panel with GFR  Other fatigue -     Comprehensive metabolic panel with GFR -     CBC with Differential/Platelet -     Urinalysis, Routine w reflex microscopic  Vitamin D  deficiency    Return in about 3 months (around 01/14/2024), or if symptoms worsen or fail to improve.  EKG reveals regular rate and rhythm with a pulse rate of 52.  No ST segment elevations or depressions.  No T wave inversions.  Pending results of labs recommended 30 minutes of exercise daily such as walking.  Will start the higher dose vitamin D  tablet.  [name],  MD

## 2023-10-15 ENCOUNTER — Encounter: Payer: Self-pay | Admitting: Family Medicine

## 2023-10-15 LAB — VITAMIN B12: Vitamin B-12: 384 pg/mL (ref 211–911)

## 2023-10-15 NOTE — Addendum Note (Signed)
 Addended by: BERNETA ELSIE LABOR on: 10/15/2023 03:04 PM   Modules accepted: Orders

## 2023-10-28 ENCOUNTER — Encounter: Payer: Self-pay | Admitting: Family Medicine

## 2023-10-28 NOTE — Addendum Note (Signed)
 Addended by: BERNETA ELSIE LABOR on: 10/28/2023 05:07 PM   Modules accepted: Orders

## 2023-10-29 DIAGNOSIS — H35033 Hypertensive retinopathy, bilateral: Secondary | ICD-10-CM | POA: Diagnosis not present

## 2023-10-29 DIAGNOSIS — H5203 Hypermetropia, bilateral: Secondary | ICD-10-CM | POA: Diagnosis not present

## 2023-10-29 DIAGNOSIS — H524 Presbyopia: Secondary | ICD-10-CM | POA: Diagnosis not present

## 2023-10-29 DIAGNOSIS — E119 Type 2 diabetes mellitus without complications: Secondary | ICD-10-CM | POA: Diagnosis not present

## 2023-10-29 DIAGNOSIS — H2513 Age-related nuclear cataract, bilateral: Secondary | ICD-10-CM | POA: Diagnosis not present

## 2023-10-29 LAB — HM DIABETES EYE EXAM

## 2023-11-01 ENCOUNTER — Encounter: Payer: Self-pay | Admitting: Family Medicine

## 2023-11-17 ENCOUNTER — Other Ambulatory Visit: Payer: Self-pay | Admitting: Family Medicine

## 2023-11-17 DIAGNOSIS — I10 Essential (primary) hypertension: Secondary | ICD-10-CM

## 2023-12-07 ENCOUNTER — Other Ambulatory Visit: Payer: Self-pay | Admitting: Family Medicine

## 2023-12-07 DIAGNOSIS — J069 Acute upper respiratory infection, unspecified: Secondary | ICD-10-CM

## 2023-12-18 DIAGNOSIS — H35363 Drusen (degenerative) of macula, bilateral: Secondary | ICD-10-CM | POA: Diagnosis not present

## 2023-12-18 DIAGNOSIS — H33322 Round hole, left eye: Secondary | ICD-10-CM | POA: Diagnosis not present

## 2023-12-18 DIAGNOSIS — H43813 Vitreous degeneration, bilateral: Secondary | ICD-10-CM | POA: Diagnosis not present

## 2023-12-18 DIAGNOSIS — E119 Type 2 diabetes mellitus without complications: Secondary | ICD-10-CM | POA: Diagnosis not present

## 2023-12-18 DIAGNOSIS — H43393 Other vitreous opacities, bilateral: Secondary | ICD-10-CM | POA: Diagnosis not present

## 2024-01-17 DIAGNOSIS — E119 Type 2 diabetes mellitus without complications: Secondary | ICD-10-CM | POA: Diagnosis not present

## 2024-01-17 DIAGNOSIS — H35363 Drusen (degenerative) of macula, bilateral: Secondary | ICD-10-CM | POA: Diagnosis not present

## 2024-01-17 DIAGNOSIS — H43393 Other vitreous opacities, bilateral: Secondary | ICD-10-CM | POA: Diagnosis not present

## 2024-01-17 DIAGNOSIS — H43813 Vitreous degeneration, bilateral: Secondary | ICD-10-CM | POA: Diagnosis not present

## 2024-01-21 ENCOUNTER — Other Ambulatory Visit: Payer: Self-pay | Admitting: Family Medicine

## 2024-01-21 DIAGNOSIS — E119 Type 2 diabetes mellitus without complications: Secondary | ICD-10-CM

## 2024-01-24 ENCOUNTER — Ambulatory Visit: Admitting: Family Medicine

## 2024-01-31 DIAGNOSIS — Z1231 Encounter for screening mammogram for malignant neoplasm of breast: Secondary | ICD-10-CM | POA: Diagnosis not present

## 2024-01-31 LAB — HM MAMMOGRAPHY

## 2024-02-14 NOTE — Progress Notes (Signed)
 IVORI STORR                                          MRN: [id number]   02/14/2024   The [facility] Quality Team Specialist reviewed this patient medical record for the purposes of chart review for care gap closure. The following were reviewed: abstraction for care gap closure-kidney health evaluation for diabetes:eGFR  and uACR.    [facility] Quality Team

## 2024-02-15 ENCOUNTER — Other Ambulatory Visit: Payer: Self-pay | Admitting: Family Medicine

## 2024-02-15 DIAGNOSIS — E119 Type 2 diabetes mellitus without complications: Secondary | ICD-10-CM

## 2024-02-15 DIAGNOSIS — I10 Essential (primary) hypertension: Secondary | ICD-10-CM

## 2024-03-20 ENCOUNTER — Encounter: Payer: Self-pay | Admitting: Family Medicine

## 2024-03-20 ENCOUNTER — Ambulatory Visit: Admitting: Family Medicine

## 2024-03-20 VITALS — BP 112/68 | HR 68 | Temp 97.4°F | Resp 16 | Ht 66.0 in | Wt 188.0 lb

## 2024-03-20 DIAGNOSIS — E559 Vitamin D deficiency, unspecified: Secondary | ICD-10-CM | POA: Diagnosis not present

## 2024-03-20 DIAGNOSIS — I10 Essential (primary) hypertension: Secondary | ICD-10-CM

## 2024-03-20 DIAGNOSIS — N1831 Chronic kidney disease, stage 3a: Secondary | ICD-10-CM

## 2024-03-20 DIAGNOSIS — E538 Deficiency of other specified B group vitamins: Secondary | ICD-10-CM | POA: Diagnosis not present

## 2024-03-20 DIAGNOSIS — E119 Type 2 diabetes mellitus without complications: Secondary | ICD-10-CM | POA: Diagnosis not present

## 2024-03-20 DIAGNOSIS — E7800 Pure hypercholesterolemia, unspecified: Secondary | ICD-10-CM

## 2024-03-20 DIAGNOSIS — E876 Hypokalemia: Secondary | ICD-10-CM

## 2024-03-20 LAB — CBC WITH DIFFERENTIAL/PLATELET
Basophils Absolute: 0 K/uL (ref 0.0–0.1)
Basophils Relative: 1.2 % (ref 0.0–3.0)
Eosinophils Absolute: 0.1 K/uL (ref 0.0–0.7)
Eosinophils Relative: 2 % (ref 0.0–5.0)
HCT: 40.2 % (ref 36.0–46.0)
Hemoglobin: 13.5 g/dL (ref 12.0–15.0)
Lymphocytes Relative: 39 % (ref 12.0–46.0)
Lymphs Abs: 1.3 K/uL (ref 0.7–4.0)
MCHC: 33.6 g/dL (ref 30.0–36.0)
MCV: 85.3 fl (ref 78.0–100.0)
Monocytes Absolute: 0.3 K/uL (ref 0.1–1.0)
Monocytes Relative: 9.3 % (ref 3.0–12.0)
Neutro Abs: 1.6 K/uL (ref 1.4–7.7)
Neutrophils Relative %: 48.5 % (ref 43.0–77.0)
Platelets: 304 K/uL (ref 150.0–400.0)
RBC: 4.71 Mil/uL (ref 3.87–5.11)
RDW: 15.1 % (ref 11.5–15.5)
WBC: 3.3 K/uL — ABNORMAL LOW (ref 4.0–10.5)

## 2024-03-20 LAB — COMPREHENSIVE METABOLIC PANEL WITH GFR
ALT: 22 U/L (ref 0–35)
AST: 26 U/L (ref 0–37)
Albumin: 4.3 g/dL (ref 3.5–5.2)
Alkaline Phosphatase: 60 U/L (ref 39–117)
BUN: 20 mg/dL (ref 6–23)
CO2: 30 meq/L (ref 19–32)
Calcium: 9.8 mg/dL (ref 8.4–10.5)
Chloride: 101 meq/L (ref 96–112)
Creatinine, Ser: 1.08 mg/dL (ref 0.40–1.20)
GFR: 50.83 mL/min — ABNORMAL LOW (ref >60.00)
Glucose, Bld: 84 mg/dL (ref 70–99)
Potassium: 3.3 meq/L — ABNORMAL LOW (ref 3.5–5.1)
Sodium: 140 meq/L (ref 135–145)
Total Bilirubin: 0.6 mg/dL (ref 0.2–1.2)
Total Protein: 7.4 g/dL (ref 6.0–8.3)

## 2024-03-20 LAB — HEMOGLOBIN A1C: Hgb A1c MFr Bld: 5.9 % (ref 4.6–6.5)

## 2024-03-20 LAB — VITAMIN D 25 HYDROXY (VIT D DEFICIENCY, FRACTURES): VITD: 35.55 ng/mL (ref 30.00–100.00)

## 2024-03-20 LAB — LDL CHOLESTEROL, DIRECT: Direct LDL: 112 mg/dL

## 2024-03-20 MED ORDER — TIRZEPATIDE 7.5 MG/0.5ML ~~LOC~~ SOAJ: 7.5000 mg | SUBCUTANEOUS | 1 refills | Status: AC

## 2024-03-20 NOTE — Progress Notes (Signed)
 Established Patient Office Visit   Subjective:  Patient ID: Ann Mcguire, female    DOB: 1950/11/06  Age: 73 y.o. MRN: [id number]  Chief Complaint  Patient presents with   Medical Management of Chronic Issues    Patient presents today for a 5 month follow-up.   Quality Metric Gaps    Foot exam    HPI Encounter Diagnoses  Name Primary?   Essential hypertension Yes   Type 2 diabetes mellitus without complication, without long-term current use of insulin (HCC)    Vitamin D  deficiency    Morbid obesity (HCC)    Stage 3a chronic kidney disease (HCC)    B12 deficiency    Elevated cholesterol    For follow-up of above.  Doing well with the tirzepatide .  Denies nausea, constipation or diarrhea.  She has increased her exercise levels and is feeling more energized.  Discussed the calcification is noted in the arteries to her breast and it is possible significance regarding her heart and other vessels.   Review of Systems  Constitutional: Negative.   HENT: Negative.    Eyes:  Negative for blurred vision, discharge and redness.  Respiratory: Negative.  Negative for shortness of breath.   Cardiovascular: Negative.  Negative for chest pain and palpitations.  Gastrointestinal:  Negative for abdominal pain.  Genitourinary: Negative.   Musculoskeletal: Negative.  Negative for myalgias.  Skin:  Negative for rash.  Neurological:  Negative for tingling, loss of consciousness and weakness.  Endo/Heme/Allergies:  Negative for polydipsia.    Current Medications[1]   Objective:     BP 112/68   Pulse 68   Temp (!) 97.4 F (36.3 C)   Resp 16   Ht 5' 6 (1.676 m)   Wt 188 lb (85.3 kg)   SpO2 98%   BMI 30.34 kg/m  Wt Readings from Last 3 Encounters:  03/20/24 188 lb (85.3 kg)  10/14/23 185 lb (83.9 kg)  05/17/23 179 lb 9.6 oz (81.5 kg)      Physical Exam Constitutional:      General: She is not in acute distress.    Appearance: Normal appearance. She is not ill-appearing,  toxic-appearing or diaphoretic.  HENT:     Head: Normocephalic and atraumatic.     Right Ear: External ear normal.     Left Ear: External ear normal.  Eyes:     General: No scleral icterus.       Right eye: No discharge.        Left eye: No discharge.     Extraocular Movements: Extraocular movements intact.     Conjunctiva/sclera: Conjunctivae normal.  Pulmonary:     Effort: Pulmonary effort is normal. No respiratory distress.  Skin:    General: Skin is warm and dry.  Neurological:     Mental Status: She is alert and oriented to person, place, and time.  Psychiatric:        Mood and Affect: Mood normal.        Behavior: Behavior normal.      No results found for any visits on 03/20/24.    The 10-year ASCVD risk score (Arnett DK, et al., 2019) is: 19.2%    Assessment & Plan:   Essential hypertension -     Comprehensive metabolic panel with GFR -     CBC with Differential/Platelet  Type 2 diabetes mellitus without complication, without long-term current use of insulin (HCC) -     Comprehensive metabolic panel with GFR -  Hemoglobin A1c -     Tirzepatide ; Inject 7.5 mg into the skin once a week.  Dispense: 6 mL; Refill: 1 -     CT CARDIAC SCORING (SELF PAY ONLY); Future  Vitamin D  deficiency -     VITAMIN D  25 Hydroxy (Vit-D Deficiency, Fractures)  Morbid obesity (HCC) -     Tirzepatide ; Inject 7.5 mg into the skin once a week.  Dispense: 6 mL; Refill: 1  Stage 3a chronic kidney disease (HCC) -     Comprehensive metabolic panel with GFR -     Tirzepatide ; Inject 7.5 mg into the skin once a week.  Dispense: 6 mL; Refill: 1 -     CT CARDIAC SCORING (SELF PAY ONLY); Future  B12 deficiency -     CBC with Differential/Platelet  Elevated cholesterol -     LDL cholesterol, direct -     CT CARDIAC SCORING (SELF PAY ONLY); Future    Return in about 6 months (around 09/18/2024).  Increase tirzepatide  to 7.5 mg weekly.  [facility] or with a rubber band at [facility].  Ann Sim Lent, MD    [1]  Current Outpatient Medications:    atorvastatin  (LIPITOR) 40 MG tablet, Take 1 tablet (40 mg total) by mouth daily., Disp: 90 tablet, Rfl: 3   fluticasone  (FLONASE ) 50 MCG/ACT nasal spray, SHAKE LIQUID AND USE 2 SPRAYS IN EACH NOSTRIL DAILY, Disp: 16 g, Rfl: 6   hydrochlorothiazide  (HYDRODIURIL ) 25 MG tablet, TAKE 1 TABLET(25 MG) BY MOUTH DAILY, Disp: 90 tablet, Rfl: 3   ibuprofen (ADVIL) 200 MG tablet, Take 400 mg by mouth every 6 (six) hours as needed for moderate pain., Disp: , Rfl:    lisinopril  (ZESTRIL ) 10 MG tablet, TAKE 1 TABLET(10 MG) BY MOUTH DAILY, Disp: 90 tablet, Rfl: 3   Multiple Vitamin (MULTIVITAMIN WITH MINERALS) TABS tablet, Take 1 tablet by mouth daily., Disp: , Rfl:    tirzepatide  (MOUNJARO ) 7.5 MG/0.5ML Pen, Inject 7.5 mg into the skin once a week., Disp: 6 mL, Rfl: 1  Current Facility-Administered Medications:    0.9 %  sodium chloride  infusion, 500 mL, Intravenous, Once, Mansouraty, Aloha Raddle., MD

## 2024-03-23 ENCOUNTER — Other Ambulatory Visit: Payer: Self-pay | Admitting: Family Medicine

## 2024-03-23 ENCOUNTER — Ambulatory Visit: Payer: Self-pay | Admitting: Family Medicine

## 2024-03-23 DIAGNOSIS — E7800 Pure hypercholesterolemia, unspecified: Secondary | ICD-10-CM

## 2024-03-23 MED ORDER — POTASSIUM CHLORIDE CRYS ER 20 MEQ PO TBCR: 20.0000 meq | EXTENDED_RELEASE_TABLET | Freq: Every day | ORAL | 3 refills | Status: AC

## 2024-03-23 NOTE — Telephone Encounter (Signed)
 Patient has been notified directly; all questions, if any, were answered. Patient voiced understanding.  Lab result

## 2024-03-23 NOTE — Addendum Note (Signed)
 Addended by: BERNETA ELSIE LABOR on: 03/23/2024 08:01 AM   Modules accepted: Orders

## 2024-04-06 ENCOUNTER — Ambulatory Visit (HOSPITAL_BASED_OUTPATIENT_CLINIC_OR_DEPARTMENT_OTHER)
Admission: RE | Admit: 2024-04-06 | Discharge: 2024-04-06 | Disposition: A | Discharge status: Home / Self Care | Payer: Self-pay | Source: Ambulatory Visit | Attending: Family Medicine | Admitting: Family Medicine

## 2024-04-06 DIAGNOSIS — E119 Type 2 diabetes mellitus without complications: Secondary | ICD-10-CM | POA: Insufficient documentation

## 2024-04-06 DIAGNOSIS — E7800 Pure hypercholesterolemia, unspecified: Secondary | ICD-10-CM | POA: Insufficient documentation

## 2024-04-06 DIAGNOSIS — N1831 Chronic kidney disease, stage 3a: Secondary | ICD-10-CM | POA: Insufficient documentation

## 2024-04-21 ENCOUNTER — Telehealth: Payer: Self-pay

## 2024-04-21 NOTE — Telephone Encounter (Signed)
 Copied from CRM #[id number]. Topic: Clinical - Lab/Test Results >> Apr 21, 2024  9:43 AM Drema MATSU wrote: Reason for CRM: Patient is requesting a callback regarding imaging results. She does not understand it.
